# Patient Record
Sex: Female | Born: 1960 | Race: Black or African American | Hispanic: No | Marital: Married | Smoking: Former smoker
Health system: Southern US, Community
[De-identification: ages and names within clinical notes are randomized; demographics above are authoritative.]

## PROBLEM LIST (undated history)

## (undated) DIAGNOSIS — I499 Cardiac arrhythmia, unspecified: Secondary | ICD-10-CM

## (undated) DIAGNOSIS — I10 Essential (primary) hypertension: Secondary | ICD-10-CM

## (undated) DIAGNOSIS — M1990 Unspecified osteoarthritis, unspecified site: Secondary | ICD-10-CM

## (undated) HISTORY — PX: APPENDECTOMY: SHX54

## (undated) HISTORY — PX: LIVER SURGERY: SHX698

## (undated) HISTORY — PX: TUBAL LIGATION: SHX77

## (undated) HISTORY — PX: CHOLECYSTECTOMY: SHX55

---

## 2011-11-11 ENCOUNTER — Emergency Department (INDEPENDENT_AMBULATORY_CARE_PROVIDER_SITE_OTHER): Payer: BC Managed Care – PPO

## 2011-11-11 ENCOUNTER — Encounter: Payer: Self-pay | Admitting: Emergency Medicine

## 2011-11-11 ENCOUNTER — Emergency Department (HOSPITAL_COMMUNITY)
Admission: EM | Admit: 2011-11-11 | Discharge: 2011-11-11 | Disposition: A | Discharge status: Other Acute Inpatient Hospital | Payer: BC Managed Care – PPO | Source: Home / Self Care | Attending: Family Medicine | Admitting: Family Medicine

## 2011-11-11 DIAGNOSIS — IMO0002 Reserved for concepts with insufficient information to code with codable children: Secondary | ICD-10-CM

## 2011-11-11 DIAGNOSIS — M179 Osteoarthritis of knee, unspecified: Secondary | ICD-10-CM

## 2011-11-11 DIAGNOSIS — M1710 Unilateral primary osteoarthritis, unspecified knee: Secondary | ICD-10-CM

## 2011-11-11 DIAGNOSIS — R059 Cough, unspecified: Secondary | ICD-10-CM

## 2011-11-11 DIAGNOSIS — R05 Cough: Secondary | ICD-10-CM

## 2011-11-11 HISTORY — DX: Essential (primary) hypertension: I10

## 2011-11-11 MED ORDER — GUAIFENESIN-CODEINE 100-10 MG/5ML PO SYRP: 5.0000 mL | ORAL_SOLUTION | Freq: Four times a day (QID) | ORAL | Status: AC | PRN

## 2011-11-11 NOTE — ED Provider Notes (Signed)
History     CSN: [id number] Arrival date & time: 11/11/2011  9:28 AM   First MD Initiated Contact with Patient 11/11/11 0913      Chief Complaint  Patient presents with  . URI    (Consider location/radiation/quality/duration/timing/severity/associated sxs/prior treatment) HPI Comments: Destiny Alexander presents for evaluation of cough, congestion, and chronic RIGHT knee pain. She reports that she has to climb several flights of stairs. She has not had any x-rays done and denies any injury.  Patient is a 50 y.o. female presenting with URI and knee pain. The history is provided by the patient.  URI The primary symptoms include cough and arthralgias. Primary symptoms do not include fever, ear pain, sore throat or wheezing. The current episode started 3 to 5 days ago. This is a new problem.  Symptoms associated with the illness include congestion and rhinorrhea. The illness is not associated with chills or sinus pressure.  Knee Pain This is a chronic problem. The problem occurs constantly. The problem has not changed since onset.The symptoms are aggravated by walking, swallowing and exertion. The symptoms are relieved by nothing. She has tried nothing for the symptoms. The treatment provided no relief.    Past Medical History  Diagnosis Date  . Hypertension   . Diabetes mellitus     Past Surgical History  Procedure Date  . Cholecystectomy   . Appendectomy   . Cesarean section   . Tubal ligation     No family history on file.  History  Substance Use Topics  . Smoking status: Former Games developer  . Smokeless tobacco: Not on file  . Alcohol Use: No    OB History    Grav Para Term Preterm Abortions TAB SAB Ect Mult Living                  Review of Systems  Constitutional: Negative for fever and chills.  HENT: Positive for congestion and rhinorrhea. Negative for ear pain, sore throat and sinus pressure.   Eyes: Negative.   Respiratory: Positive for cough. Negative for wheezing.     Cardiovascular: Negative.   Gastrointestinal: Negative.   Genitourinary: Negative.   Musculoskeletal: Positive for arthralgias and gait problem. Negative for joint swelling.       Chronic RIGHT knee pain  Neurological: Negative.     Allergies  Review of patient's allergies indicates no known allergies.  Home Medications   Current Outpatient Rx  Name Route Sig Dispense Refill  . GLIMEPIRIDE 4 MG PO TABS Oral Take 4 mg by mouth daily before breakfast.      . GUAIFENESIN ER 600 MG PO TB12 Oral Take 1,200 mg by mouth 2 (two) times daily.      Marland Kitchen HYDROCHLOROTHIAZIDE 25 MG PO TABS Oral Take 25 mg by mouth daily.      . NYQUIL PO Oral Take by mouth.      . DAYQUIL PO Oral Take by mouth.        BP 184/103  Pulse 98  Temp(Src) 98.6 F (37 C) (Oral)  Resp 20  SpO2 100%  LMP 11/11/2011  Physical Exam  Nursing note and vitals reviewed. Constitutional: She is oriented to person, place, and time. She appears well-developed and well-nourished.  HENT:  Head: Normocephalic and atraumatic.  Right Ear: External ear normal. Tympanic membrane is retracted.  Left Ear: External ear normal. Tympanic membrane is retracted.  Nose: Nose normal.  Mouth/Throat: Uvula is midline, oropharynx is clear and moist and mucous membranes are normal.  Eyes:  EOM are normal. Pupils are equal, round, and reactive to light.  Neck: Normal range of motion.  Cardiovascular: Normal rate and regular rhythm.   Pulmonary/Chest: Effort normal and breath sounds normal.  Neurological: She is alert and oriented to person, place, and time.  Skin: Skin is warm and dry.    ED Course  Procedures (including critical care time)  Labs Reviewed - No data to display No results found.   No diagnosis found.    MDM  Viral URI; RIGHT knee pain: x-ray: degenerative changes in medial compartment (spoke with radiologist)        Richardo Priest, MD 11/11/11 [phone]

## 2011-11-11 NOTE — ED Notes (Signed)
Multiple complaints.  Cough, chest congestion, feels sob, hot/cold episodes.  Was green/gray..now phlegm is clear per patient.  Knee pain since may 2012.  No injury, frequent step use to her home

## 2012-12-28 ENCOUNTER — Encounter (HOSPITAL_COMMUNITY): Payer: Self-pay | Admitting: Emergency Medicine

## 2012-12-28 ENCOUNTER — Emergency Department (HOSPITAL_COMMUNITY)
Admission: EM | Admit: 2012-12-28 | Discharge: 2012-12-28 | Disposition: A | Discharge status: Home / Self Care | Payer: BC Managed Care – PPO | Source: Home / Self Care

## 2012-12-28 ENCOUNTER — Emergency Department (INDEPENDENT_AMBULATORY_CARE_PROVIDER_SITE_OTHER): Payer: BC Managed Care – PPO

## 2012-12-28 DIAGNOSIS — B349 Viral infection, unspecified: Secondary | ICD-10-CM

## 2012-12-28 DIAGNOSIS — B9789 Other viral agents as the cause of diseases classified elsewhere: Secondary | ICD-10-CM

## 2012-12-28 LAB — POCT RAPID STREP A: Streptococcus, Group A Screen (Direct): NEGATIVE

## 2012-12-28 MED ORDER — OSELTAMIVIR PHOSPHATE 75 MG PO CAPS
75.0000 mg | ORAL_CAPSULE | Freq: Two times a day (BID) | ORAL | Status: DC
Start: 2012-12-28 — End: 2017-01-08

## 2012-12-28 NOTE — ED Provider Notes (Signed)
Medical screening examination/treatment/procedure(s) were performed by resident physician or non-physician practitioner and as supervising physician I was immediately available for consultation/collaboration.   Barkley Bruns MD.    Linna Hoff, MD 12/28/12 [phone]

## 2012-12-28 NOTE — ED Provider Notes (Signed)
History     CSN: [id number]  Arrival date & time 12/28/12  1148   None     Chief Complaint  Patient presents with  . URI     Patient is a 52 y.o. female presenting with flu symptoms. The history is provided by the patient.  Influenza The current episode started 2 days ago. The problem occurs constantly. The problem has been gradually worsening. Associated symptoms include headaches. Pertinent negatives include no shortness of breath. Nothing aggravates the symptoms. Nothing relieves the symptoms.  Pt reports onset of flu-like symptoms late Wednesday. States it started with a cough. She though it was her allergies. Other sx's include low grade fever, body aches, sore throat, mild ear pain and h/a's.   Past Medical History  Diagnosis Date  . Hypertension   . Diabetes mellitus     Past Surgical History  Procedure Date  . Cholecystectomy   . Appendectomy   . Cesarean section   . Tubal ligation     No family history on file.  History  Substance Use Topics  . Smoking status: Former Games developer  . Smokeless tobacco: Not on file  . Alcohol Use: No    OB History    Grav Para Term Preterm Abortions TAB SAB Ect Mult Living                  Review of Systems  Constitutional: Negative.   HENT: Positive for ear pain, congestion and sore throat. Negative for sneezing, neck pain, voice change, sinus pressure and ear discharge.   Eyes: Negative.   Respiratory: Positive for cough. Negative for shortness of breath.   Cardiovascular: Negative.   Gastrointestinal: Negative.   Genitourinary: Negative.   Musculoskeletal: Negative.   Neurological: Positive for headaches.  Hematological: Negative.   Psychiatric/Behavioral: Negative.     Allergies  Amoxicillin  Home Medications   Current Outpatient Rx  Name  Route  Sig  Dispense  Refill  . CETIRIZINE HCL PO   Oral   Take by mouth.         Marland Kitchen GLIMEPIRIDE 4 MG PO TABS   Oral   Take 4 mg by mouth daily before breakfast.          . HYDROCHLOROTHIAZIDE 25 MG PO TABS   Oral   Take 25 mg by mouth daily.           Marland Kitchen SITAGLIPTIN PHOSPHATE 100 MG PO TABS   Oral   Take 100 mg by mouth daily.         . GUAIFENESIN ER 600 MG PO TB12   Oral   Take 1,200 mg by mouth 2 (two) times daily.           . NYQUIL PO   Oral   Take by mouth.           . DAYQUIL PO   Oral   Take by mouth.             BP 129/77  Pulse 107  Temp 100.3 F (37.9 C) (Oral)  Resp 18  SpO2 100%  LMP 12/15/2012  Physical Exam  Constitutional: She is oriented to person, place, and time. She appears well-developed and well-nourished.  HENT:  Head: Normocephalic and atraumatic.  Right Ear: External ear normal.  Left Ear: External ear normal.  Nose: Nose normal.  Mouth/Throat: Oropharynx is clear and moist.  Eyes: Conjunctivae normal are normal.  Neck: Neck supple.  Cardiovascular: Normal rate and regular rhythm.  Pulmonary/Chest: Effort normal. She has decreased breath sounds in the right middle field.       Mild exp wheezes to (R) mid and upper lobe areas but otherwise CTA.  Musculoskeletal: Normal range of motion.  Neurological: She is alert and oriented to person, place, and time.  Skin: Skin is warm and dry.  Psychiatric: She has a normal mood and affect.    ED Course  Procedures    Labs Reviewed  POCT RAPID STREP A ([facility] ONLY)   Dg Chest 2 View  12/28/2012  *RADIOLOGY REPORT*  Clinical Data: Cough for 4 days.   URI.  CHEST - 2 VIEW  Comparison: None.  Findings: Mild cardiomegaly.  Pulmonary vascularity normal.  The lungs are clear.  No airspace disease, effusion, or pneumothorax. No acute osseous abnormality.  Visualized upper abdomen is unremarkable.  IMPRESSION: Mild cardiomegaly.  No acute cardiopulmonary disease.   Original Report Authenticated By: Britta Mccreedy, M.D.      No diagnosis found.    MDM  2 day h/o viral type sx's w/ fever. CXR neg. Will treat with  Tamiflu.        Leanne Chang, NP 12/28/12 [phone]

## 2012-12-28 NOTE — ED Notes (Signed)
Pt c/o cold/flu like sx x4 days.  Sx include: headache, body aches, sore throat, cough w/yellow mucous Denies: fevers, vomiting, nauseas, diarrhea Taking Cetrizine thinking it was her allergies  She is alert w/no signs of acute distress.

## 2014-07-08 ENCOUNTER — Other Ambulatory Visit: Payer: Self-pay

## 2014-07-08 DIAGNOSIS — Z1231 Encounter for screening mammogram for malignant neoplasm of breast: Secondary | ICD-10-CM

## 2014-07-20 ENCOUNTER — Ambulatory Visit
Admission: RE | Admit: 2014-07-20 | Discharge: 2014-07-20 | Disposition: A | Discharge status: Home / Self Care | Payer: BC Managed Care – PPO | Source: Ambulatory Visit

## 2014-07-20 DIAGNOSIS — Z1231 Encounter for screening mammogram for malignant neoplasm of breast: Secondary | ICD-10-CM

## 2015-04-30 ENCOUNTER — Other Ambulatory Visit: Payer: Self-pay | Admitting: Physician Assistant

## 2015-04-30 ENCOUNTER — Other Ambulatory Visit (HOSPITAL_COMMUNITY)
Admission: RE | Admit: 2015-04-30 | Discharge: 2015-04-30 | Disposition: A | Discharge status: Home / Self Care | Payer: BLUE CROSS/BLUE SHIELD | Source: Ambulatory Visit | Attending: Physician Assistant | Admitting: Physician Assistant

## 2015-04-30 DIAGNOSIS — Z1151 Encounter for screening for human papillomavirus (HPV): Secondary | ICD-10-CM | POA: Insufficient documentation

## 2015-04-30 DIAGNOSIS — Z124 Encounter for screening for malignant neoplasm of cervix: Secondary | ICD-10-CM | POA: Insufficient documentation

## 2015-05-03 LAB — CYTOLOGY - PAP

## 2016-03-28 DIAGNOSIS — E1165 Type 2 diabetes mellitus with hyperglycemia: Secondary | ICD-10-CM | POA: Diagnosis not present

## 2016-03-28 DIAGNOSIS — E782 Mixed hyperlipidemia: Secondary | ICD-10-CM | POA: Diagnosis not present

## 2016-03-28 DIAGNOSIS — E559 Vitamin D deficiency, unspecified: Secondary | ICD-10-CM | POA: Diagnosis not present

## 2016-03-29 DIAGNOSIS — I10 Essential (primary) hypertension: Secondary | ICD-10-CM | POA: Diagnosis not present

## 2016-03-29 DIAGNOSIS — E559 Vitamin D deficiency, unspecified: Secondary | ICD-10-CM | POA: Diagnosis not present

## 2016-03-29 DIAGNOSIS — E782 Mixed hyperlipidemia: Secondary | ICD-10-CM | POA: Diagnosis not present

## 2016-03-29 DIAGNOSIS — E1165 Type 2 diabetes mellitus with hyperglycemia: Secondary | ICD-10-CM | POA: Diagnosis not present

## 2016-04-12 DIAGNOSIS — I10 Essential (primary) hypertension: Secondary | ICD-10-CM | POA: Diagnosis not present

## 2016-04-12 DIAGNOSIS — E782 Mixed hyperlipidemia: Secondary | ICD-10-CM | POA: Diagnosis not present

## 2016-04-12 DIAGNOSIS — E1165 Type 2 diabetes mellitus with hyperglycemia: Secondary | ICD-10-CM | POA: Diagnosis not present

## 2016-04-12 DIAGNOSIS — E559 Vitamin D deficiency, unspecified: Secondary | ICD-10-CM | POA: Diagnosis not present

## 2016-06-29 DIAGNOSIS — Z0000 Encounter for general adult medical examination without abnormal findings: Secondary | ICD-10-CM | POA: Diagnosis not present

## 2016-07-10 DIAGNOSIS — E782 Mixed hyperlipidemia: Secondary | ICD-10-CM | POA: Diagnosis not present

## 2016-07-10 DIAGNOSIS — E1165 Type 2 diabetes mellitus with hyperglycemia: Secondary | ICD-10-CM | POA: Diagnosis not present

## 2016-07-10 DIAGNOSIS — E559 Vitamin D deficiency, unspecified: Secondary | ICD-10-CM | POA: Diagnosis not present

## 2016-07-11 DIAGNOSIS — E559 Vitamin D deficiency, unspecified: Secondary | ICD-10-CM | POA: Diagnosis not present

## 2016-07-11 DIAGNOSIS — E782 Mixed hyperlipidemia: Secondary | ICD-10-CM | POA: Diagnosis not present

## 2016-07-11 DIAGNOSIS — E1165 Type 2 diabetes mellitus with hyperglycemia: Secondary | ICD-10-CM | POA: Diagnosis not present

## 2016-07-11 DIAGNOSIS — I10 Essential (primary) hypertension: Secondary | ICD-10-CM | POA: Diagnosis not present

## 2016-08-24 ENCOUNTER — Ambulatory Visit (HOSPITAL_COMMUNITY)
Admission: EM | Admit: 2016-08-24 | Discharge: 2016-08-24 | Disposition: A | Discharge status: Home / Self Care | Payer: BLUE CROSS/BLUE SHIELD | Attending: Internal Medicine | Admitting: Internal Medicine

## 2016-08-24 ENCOUNTER — Encounter (HOSPITAL_COMMUNITY): Payer: Self-pay | Admitting: Emergency Medicine

## 2016-08-24 DIAGNOSIS — R0789 Other chest pain: Secondary | ICD-10-CM

## 2016-08-24 MED ORDER — IBUPROFEN 800 MG PO TABS
ORAL_TABLET | ORAL | Status: AC
  Filled 2016-08-24: qty 1

## 2016-08-24 MED ORDER — IBUPROFEN 800 MG PO TABS
800.0000 mg | ORAL_TABLET | Freq: Once | ORAL | Status: AC
  Administered 2016-08-24: 800 mg via ORAL

## 2016-08-24 MED ORDER — CYCLOBENZAPRINE HCL 10 MG PO TABS: 10.0000 mg | ORAL_TABLET | Freq: Every evening | ORAL | 1 refills | Status: AC | PRN

## 2016-08-24 NOTE — ED Triage Notes (Addendum)
Pt c/o constant left sided CP onset last night  Denies SOB/dyspnea, n/v, weakness, blurred vision, HA Denies inj/trauma  Reports pain increases w/activity .... Hx of HTN and DM  Steady gait.... A&O x4... NAD

## 2016-08-24 NOTE — ED Provider Notes (Signed)
[facility]    CSN: [id number] Arrival date & time: 08/24/16  1830  First Provider Contact:  None       History   Chief Complaint Chief Complaint  Patient presents with  . Chest Pain    HPI Destiny Alexander is a 55 y.o. female.   Patient with PMH of obesity and DM2 presents to urgent care c/o chest pain that began last night at rest and has persisted until now without relief. She has used naproxen without significant relief.  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  Denies SOB, nausea, vomiting or diaphoresis.  Also has had cough for which she has been seen recently.  Cough exacerbates pain.        Past Medical History:  Diagnosis Date  . Diabetes mellitus   . Hypertension     There are no active problems to display for this patient.   Past Surgical History:  Procedure Laterality Date  . APPENDECTOMY    . CESAREAN SECTION    . CHOLECYSTECTOMY    . TUBAL LIGATION      OB History    No data available       Home Medications    Prior to Admission medications   Medication Sig Start Date End Date Taking? Authorizing Provider  glimepiride (AMARYL) 4 MG tablet Take 4 mg by mouth daily before breakfast.     Yes Historical Provider, MD  Insulin Glargine (TOUJEO SOLOSTAR [place]) Inject into the skin.   Yes Historical Provider, MD  levocetirizine (XYZAL) 5 MG tablet Take 5 mg by mouth every evening.   Yes Historical Provider, MD  Liraglutide (VICTOZA ) Inject into the skin.   Yes Historical Provider, MD  naproxen (NAPROSYN) 500 MG tablet Take 500 mg by mouth 2 (two) times daily with a meal.   Yes Historical Provider, MD  pravastatin (PRAVACHOL) 10 MG tablet Take 10 mg by mouth daily.   Yes Historical Provider, MD  CETIRIZINE HCL PO Take by mouth.    Historical Provider, MD  cyclobenzaprine (FLEXERIL) 10 MG tablet Take 1 tablet (10 mg total) by mouth at bedtime as needed for muscle spasms. 08/24/16 09/03/16  Arnaldo NatalMichael S Taye Cato, MD  guaiFENesin  (MUCINEX) 600 MG 12 hr tablet Take 1,200 mg by mouth 2 (two) times daily.      Historical Provider, MD  hydrochlorothiazide (HYDRODIURIL) 25 MG tablet Take 25 mg by mouth daily.      Historical Provider, MD  oseltamivir (TAMIFLU) 75 MG capsule Take 1 capsule (75 mg total) by mouth every 12 (twelve) hours. 12/28/12   Roma KayserKatherine P Schorr, NP  Pseudoeph-Doxylamine-DM-APAP (NYQUIL PO) Take by mouth.      Historical Provider, MD  Pseudoephedrine-APAP-DM (DAYQUIL PO) Take by mouth.      Historical Provider, MD  sitaGLIPtin (JANUVIA) 100 MG tablet Take 100 mg by mouth daily.    Historical Provider, MD    Family History History reviewed. No pertinent family history.  Social History Social History  Substance Use Topics  . Smoking status: Former Games developermoker  . Smokeless tobacco: Never Used  . Alcohol use No     Allergies   Amoxicillin   Review of Systems Review of Systems  Constitutional: Negative for chills and fever.  HENT: Negative for ear pain and sore throat.   Eyes: Negative for pain and visual disturbance.  Respiratory: Negative for cough and shortness of breath.   Cardiovascular: Positive for chest pain. Negative for palpitations.  Gastrointestinal: Negative for abdominal pain  and vomiting.  Genitourinary: Negative for dysuria and hematuria.  Musculoskeletal: Negative for arthralgias and back pain.  Skin: Negative for color change and rash.  Neurological: Negative for seizures and syncope.  All other systems reviewed and are negative.    Physical Exam Triage Vital Signs ED Triage Vitals  Enc Vitals Group     BP 08/24/16 1931 130/57     Pulse Rate 08/24/16 1931 85     Resp 08/24/16 1931 16     Temp 08/24/16 1931 98.2 F (36.8 C)     Temp Source 08/24/16 1931 Oral     SpO2 08/24/16 1931 100 %     Weight --      Height --      Head Circumference --      Peak Flow --      Pain Score 08/24/16 1937 6     Pain Loc --      Pain Edu? --      Excl. in GC? --    No data  found.   Updated Vital Signs BP 130/57 (BP Location: Left Arm)   Pulse 85   Temp 98.2 F (36.8 C) (Oral)   Resp 16   SpO2 100%   Visual Acuity Right Eye Distance:   Left Eye Distance:   Bilateral Distance:    Right Eye Near:   Left Eye Near:    Bilateral Near:     Physical Exam  Constitutional: She is oriented to person, place, and time. She appears well-developed and well-nourished. No distress.  HENT:  Head: Normocephalic and atraumatic.  Mouth/Throat: Oropharynx is clear and moist.  Eyes: Conjunctivae and EOM are normal. Pupils are equal, round, and reactive to light. No scleral icterus.  Neck: Normal range of motion. Neck supple. No JVD present. No tracheal deviation present. No thyromegaly present.  Cardiovascular: Normal rate, regular rhythm and normal heart sounds.  Exam reveals no gallop and no friction rub.   No murmur heard. Pulmonary/Chest: Effort normal and breath sounds normal.  Abdominal: Soft. Bowel sounds are normal. She exhibits no distension. There is no tenderness.  Musculoskeletal: Normal range of motion. She exhibits no edema.  Lymphadenopathy:    She has no cervical adenopathy.  Neurological: She is alert and oriented to person, place, and time. No cranial nerve deficit.  Skin: Skin is warm and dry.  Psychiatric: She has a normal mood and affect. Her behavior is normal. Judgment and thought content normal.  Nursing note and vitals reviewed.    UC Treatments / Results  Labs (all labs ordered are listed, but only abnormal results are displayed) Labs Reviewed - No data to display  EKG  EKG Interpretation None       Radiology No results found.  Procedures Procedures (including critical care time)  Medications Ordered in UC Medications  ibuprofen (ADVIL,MOTRIN) tablet 800 mg (not administered)     Initial Impression / Assessment and Plan / UC Course  I have reviewed the triage vital signs and the nursing notes.  Pertinent labs &  imaging results that were available during my care of the patient were reviewed by me and considered in my medical decision making (see chart for details).  Clinical Course    Musculoskeletal chest pain.  Non cardiac.  Rx NSAID and muscle relaxer.  Final Clinical Impressions(s) / UC Diagnoses   Final diagnoses:  Chest wall pain    New Prescriptions New Prescriptions   CYCLOBENZAPRINE (FLEXERIL) 10 MG TABLET    Take 1  tablet (10 mg total) by mouth at bedtime as needed for muscle spasms.     Arnaldo Natal, MD 08/24/16 2033

## 2016-09-08 DIAGNOSIS — H5213 Myopia, bilateral: Secondary | ICD-10-CM | POA: Diagnosis not present

## 2016-10-16 DIAGNOSIS — E559 Vitamin D deficiency, unspecified: Secondary | ICD-10-CM | POA: Diagnosis not present

## 2016-10-16 DIAGNOSIS — E782 Mixed hyperlipidemia: Secondary | ICD-10-CM | POA: Diagnosis not present

## 2016-10-16 DIAGNOSIS — E1165 Type 2 diabetes mellitus with hyperglycemia: Secondary | ICD-10-CM | POA: Diagnosis not present

## 2016-10-24 DIAGNOSIS — E782 Mixed hyperlipidemia: Secondary | ICD-10-CM | POA: Diagnosis not present

## 2016-10-24 DIAGNOSIS — I10 Essential (primary) hypertension: Secondary | ICD-10-CM | POA: Diagnosis not present

## 2016-10-24 DIAGNOSIS — E1165 Type 2 diabetes mellitus with hyperglycemia: Secondary | ICD-10-CM | POA: Diagnosis not present

## 2016-10-24 DIAGNOSIS — Z23 Encounter for immunization: Secondary | ICD-10-CM | POA: Diagnosis not present

## 2016-10-24 DIAGNOSIS — E559 Vitamin D deficiency, unspecified: Secondary | ICD-10-CM | POA: Diagnosis not present

## 2016-10-31 ENCOUNTER — Encounter (HOSPITAL_COMMUNITY): Payer: Self-pay | Admitting: *Deleted

## 2016-10-31 ENCOUNTER — Emergency Department (HOSPITAL_COMMUNITY): Payer: BLUE CROSS/BLUE SHIELD

## 2016-10-31 ENCOUNTER — Emergency Department (HOSPITAL_COMMUNITY)
Admission: EM | Admit: 2016-10-31 | Discharge: 2016-10-31 | Disposition: A | Discharge status: Home / Self Care | Payer: BLUE CROSS/BLUE SHIELD | Attending: Emergency Medicine | Admitting: Emergency Medicine

## 2016-10-31 DIAGNOSIS — I10 Essential (primary) hypertension: Secondary | ICD-10-CM | POA: Insufficient documentation

## 2016-10-31 DIAGNOSIS — Z794 Long term (current) use of insulin: Secondary | ICD-10-CM | POA: Diagnosis not present

## 2016-10-31 DIAGNOSIS — E119 Type 2 diabetes mellitus without complications: Secondary | ICD-10-CM | POA: Diagnosis not present

## 2016-10-31 DIAGNOSIS — Z87891 Personal history of nicotine dependence: Secondary | ICD-10-CM | POA: Diagnosis not present

## 2016-10-31 DIAGNOSIS — R0789 Other chest pain: Secondary | ICD-10-CM | POA: Diagnosis not present

## 2016-10-31 DIAGNOSIS — R0781 Pleurodynia: Secondary | ICD-10-CM | POA: Diagnosis not present

## 2016-10-31 DIAGNOSIS — Y999 Unspecified external cause status: Secondary | ICD-10-CM | POA: Insufficient documentation

## 2016-10-31 DIAGNOSIS — Y939 Activity, unspecified: Secondary | ICD-10-CM | POA: Insufficient documentation

## 2016-10-31 DIAGNOSIS — S43004A Unspecified dislocation of right shoulder joint, initial encounter: Secondary | ICD-10-CM | POA: Diagnosis not present

## 2016-10-31 DIAGNOSIS — M25511 Pain in right shoulder: Secondary | ICD-10-CM | POA: Insufficient documentation

## 2016-10-31 DIAGNOSIS — S31609A Unspecified open wound of abdominal wall, unspecified quadrant with penetration into peritoneal cavity, initial encounter: Secondary | ICD-10-CM | POA: Diagnosis not present

## 2016-10-31 DIAGNOSIS — S299XXA Unspecified injury of thorax, initial encounter: Secondary | ICD-10-CM | POA: Diagnosis not present

## 2016-10-31 DIAGNOSIS — Y92410 Unspecified street and highway as the place of occurrence of the external cause: Secondary | ICD-10-CM | POA: Diagnosis not present

## 2016-10-31 DIAGNOSIS — R100 Acute abdomen: Secondary | ICD-10-CM | POA: Diagnosis not present

## 2016-10-31 MED ORDER — IBUPROFEN 400 MG PO TABS
600.0000 mg | ORAL_TABLET | Freq: Once | ORAL | Status: AC
  Administered 2016-10-31: 600 mg via ORAL
  Filled 2016-10-31: qty 1

## 2016-10-31 MED ORDER — METHOCARBAMOL 500 MG PO TABS: 500.0000 mg | ORAL_TABLET | Freq: Every evening | ORAL | 0 refills | Status: DC | PRN

## 2016-10-31 MED ORDER — METHOCARBAMOL 500 MG PO TABS
1000.0000 mg | ORAL_TABLET | Freq: Once | ORAL | Status: AC
  Administered 2016-10-31: 1000 mg via ORAL
  Filled 2016-10-31: qty 2

## 2016-10-31 MED ORDER — IBUPROFEN 600 MG PO TABS: 600.0000 mg | ORAL_TABLET | Freq: Four times a day (QID) | ORAL | 0 refills | Status: DC | PRN
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
## 2016-11-09 DIAGNOSIS — E782 Mixed hyperlipidemia: Secondary | ICD-10-CM | POA: Diagnosis not present

## 2016-11-09 DIAGNOSIS — E559 Vitamin D deficiency, unspecified: Secondary | ICD-10-CM | POA: Diagnosis not present

## 2016-11-09 DIAGNOSIS — E1165 Type 2 diabetes mellitus with hyperglycemia: Secondary | ICD-10-CM | POA: Diagnosis not present

## 2016-11-09 DIAGNOSIS — I10 Essential (primary) hypertension: Secondary | ICD-10-CM | POA: Diagnosis not present

## 2016-11-10 DIAGNOSIS — S43001D Unspecified subluxation of right shoulder joint, subsequent encounter: Secondary | ICD-10-CM | POA: Diagnosis not present

## 2016-11-24 DIAGNOSIS — M25511 Pain in right shoulder: Secondary | ICD-10-CM | POA: Diagnosis not present

## 2016-11-24 DIAGNOSIS — M542 Cervicalgia: Secondary | ICD-10-CM | POA: Diagnosis not present

## 2016-11-28 DIAGNOSIS — M542 Cervicalgia: Secondary | ICD-10-CM | POA: Diagnosis not present

## 2016-11-29 DIAGNOSIS — M7541 Impingement syndrome of right shoulder: Secondary | ICD-10-CM | POA: Diagnosis not present

## 2016-11-29 DIAGNOSIS — M542 Cervicalgia: Secondary | ICD-10-CM | POA: Diagnosis not present

## 2016-11-29 DIAGNOSIS — M25511 Pain in right shoulder: Secondary | ICD-10-CM | POA: Diagnosis not present

## 2016-11-29 DIAGNOSIS — S46011A Strain of muscle(s) and tendon(s) of the rotator cuff of right shoulder, initial encounter: Secondary | ICD-10-CM | POA: Diagnosis not present

## 2016-12-07 DIAGNOSIS — M5416 Radiculopathy, lumbar region: Secondary | ICD-10-CM | POA: Diagnosis not present

## 2016-12-07 DIAGNOSIS — M542 Cervicalgia: Secondary | ICD-10-CM | POA: Diagnosis not present

## 2016-12-07 DIAGNOSIS — M7541 Impingement syndrome of right shoulder: Secondary | ICD-10-CM | POA: Diagnosis not present

## 2016-12-12 DIAGNOSIS — F0781 Postconcussional syndrome: Secondary | ICD-10-CM | POA: Diagnosis not present

## 2016-12-12 DIAGNOSIS — S060X1A Concussion with loss of consciousness of 30 minutes or less, initial encounter: Secondary | ICD-10-CM | POA: Diagnosis not present

## 2016-12-15 DIAGNOSIS — M5416 Radiculopathy, lumbar region: Secondary | ICD-10-CM | POA: Diagnosis not present

## 2016-12-25 DIAGNOSIS — M542 Cervicalgia: Secondary | ICD-10-CM | POA: Diagnosis not present

## 2016-12-25 DIAGNOSIS — S060X1D Concussion with loss of consciousness of 30 minutes or less, subsequent encounter: Secondary | ICD-10-CM | POA: Diagnosis not present

## 2016-12-25 DIAGNOSIS — M7541 Impingement syndrome of right shoulder: Secondary | ICD-10-CM | POA: Diagnosis not present

## 2016-12-25 DIAGNOSIS — M5416 Radiculopathy, lumbar region: Secondary | ICD-10-CM | POA: Diagnosis not present

## 2016-12-29 ENCOUNTER — Ambulatory Visit: Payer: BLUE CROSS/BLUE SHIELD | Admitting: Neurology

## 2016-12-29 ENCOUNTER — Telehealth: Payer: Self-pay | Admitting: *Deleted

## 2016-12-29 NOTE — Telephone Encounter (Signed)
Called pt. R/s appt from 12/28/16 since office closed. [facility]K 12/29/16. Patient requested appt before 01/15/17. Dr Lucia GaskinsAhern did not have any openings. Pt ok to see different provider. Scheduled with Dr Marjory LiesPenumalli. She is a new patient.

## 2017-01-04 ENCOUNTER — Encounter: Payer: Self-pay | Admitting: *Deleted

## 2017-01-08 ENCOUNTER — Encounter: Payer: Self-pay | Admitting: Diagnostic Neuroimaging

## 2017-01-08 ENCOUNTER — Ambulatory Visit (INDEPENDENT_AMBULATORY_CARE_PROVIDER_SITE_OTHER): Payer: BLUE CROSS/BLUE SHIELD | Admitting: Diagnostic Neuroimaging

## 2017-01-08 VITALS — BP 148/84 | HR 88 | Ht 65.0 in | Wt 309.5 lb

## 2017-01-08 DIAGNOSIS — F0781 Postconcussional syndrome: Secondary | ICD-10-CM

## 2017-01-08 DIAGNOSIS — R0683 Snoring: Secondary | ICD-10-CM | POA: Diagnosis not present

## 2017-01-08 DIAGNOSIS — Z794 Long term (current) use of insulin: Secondary | ICD-10-CM | POA: Diagnosis not present

## 2017-01-08 DIAGNOSIS — R5383 Other fatigue: Secondary | ICD-10-CM | POA: Diagnosis not present

## 2017-01-08 DIAGNOSIS — I10 Essential (primary) hypertension: Secondary | ICD-10-CM | POA: Diagnosis not present

## 2017-01-08 DIAGNOSIS — Z6841 Body Mass Index (BMI) 40.0 and over, adult: Secondary | ICD-10-CM | POA: Diagnosis not present

## 2017-01-08 DIAGNOSIS — S140XXA Concussion and edema of cervical spinal cord, initial encounter: Secondary | ICD-10-CM

## 2017-01-08 DIAGNOSIS — E119 Type 2 diabetes mellitus without complications: Secondary | ICD-10-CM

## 2017-01-08 DIAGNOSIS — G44329 Chronic post-traumatic headache, not intractable: Secondary | ICD-10-CM
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
## 2017-01-08 NOTE — Patient Instructions (Signed)

## 2017-01-15 DIAGNOSIS — S060X1D Concussion with loss of consciousness of 30 minutes or less, subsequent encounter: Secondary | ICD-10-CM | POA: Diagnosis not present

## 2017-01-15 DIAGNOSIS — S46011D Strain of muscle(s) and tendon(s) of the rotator cuff of right shoulder, subsequent encounter: Secondary | ICD-10-CM | POA: Diagnosis not present

## 2017-01-15 DIAGNOSIS — M25562 Pain in left knee: Secondary | ICD-10-CM | POA: Diagnosis not present

## 2017-01-15 DIAGNOSIS — M542 Cervicalgia: Secondary | ICD-10-CM | POA: Diagnosis not present

## 2017-01-16 ENCOUNTER — Other Ambulatory Visit: Payer: Self-pay | Admitting: Specialist

## 2017-01-16 DIAGNOSIS — M542 Cervicalgia: Secondary | ICD-10-CM

## 2017-01-17 ENCOUNTER — Ambulatory Visit
Admission: RE | Admit: 2017-01-17 | Discharge: 2017-01-17 | Disposition: A | Discharge status: Home / Self Care | Payer: BLUE CROSS/BLUE SHIELD | Source: Ambulatory Visit | Attending: Specialist | Admitting: Specialist

## 2017-01-17 DIAGNOSIS — M4802 Spinal stenosis, cervical region: Secondary | ICD-10-CM | POA: Diagnosis not present

## 2017-01-17 DIAGNOSIS — M542 Cervicalgia: Secondary | ICD-10-CM

## 2017-01-23 DIAGNOSIS — M5416 Radiculopathy, lumbar region: Secondary | ICD-10-CM | POA: Diagnosis not present

## 2017-01-23 DIAGNOSIS — M542 Cervicalgia: Secondary | ICD-10-CM | POA: Diagnosis not present

## 2017-01-31 DIAGNOSIS — M542 Cervicalgia: Secondary | ICD-10-CM | POA: Diagnosis not present

## 2017-01-31 DIAGNOSIS — M5416 Radiculopathy, lumbar region: Secondary | ICD-10-CM | POA: Diagnosis not present

## 2017-02-05 DIAGNOSIS — S335XXA Sprain of ligaments of lumbar spine, initial encounter: Secondary | ICD-10-CM | POA: Diagnosis not present

## 2017-02-05 DIAGNOSIS — M542 Cervicalgia: Secondary | ICD-10-CM | POA: Diagnosis not present

## 2017-02-05 DIAGNOSIS — M48061 Spinal stenosis, lumbar region without neurogenic claudication: Secondary | ICD-10-CM | POA: Diagnosis not present

## 2017-02-07 DIAGNOSIS — M542 Cervicalgia: Secondary | ICD-10-CM | POA: Diagnosis not present

## 2017-02-07 DIAGNOSIS — M5416 Radiculopathy, lumbar region: Secondary | ICD-10-CM | POA: Diagnosis not present

## 2017-02-14 DIAGNOSIS — M542 Cervicalgia: Secondary | ICD-10-CM | POA: Diagnosis not present

## 2017-02-14 DIAGNOSIS — M5416 Radiculopathy, lumbar region: Secondary | ICD-10-CM | POA: Diagnosis not present
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
## 2017-02-21 DIAGNOSIS — M5416 Radiculopathy, lumbar region: Secondary | ICD-10-CM | POA: Diagnosis not present

## 2017-02-21 DIAGNOSIS — M542 Cervicalgia: Secondary | ICD-10-CM | POA: Diagnosis not present

## 2017-02-28 DIAGNOSIS — M542 Cervicalgia: Secondary | ICD-10-CM | POA: Diagnosis not present

## 2017-02-28 DIAGNOSIS — M5416 Radiculopathy, lumbar region: Secondary | ICD-10-CM | POA: Diagnosis not present

## 2017-03-05 DIAGNOSIS — E119 Type 2 diabetes mellitus without complications: Secondary | ICD-10-CM | POA: Diagnosis not present

## 2017-03-07 DIAGNOSIS — M542 Cervicalgia: Secondary | ICD-10-CM | POA: Diagnosis not present

## 2017-03-07 DIAGNOSIS — M5416 Radiculopathy, lumbar region: Secondary | ICD-10-CM | POA: Diagnosis not present

## 2017-03-09 DIAGNOSIS — E1165 Type 2 diabetes mellitus with hyperglycemia: Secondary | ICD-10-CM | POA: Diagnosis not present

## 2017-03-09 DIAGNOSIS — I10 Essential (primary) hypertension: Secondary | ICD-10-CM | POA: Diagnosis not present

## 2017-03-09 DIAGNOSIS — E559 Vitamin D deficiency, unspecified: Secondary | ICD-10-CM | POA: Diagnosis not present

## 2017-03-09 DIAGNOSIS — E782 Mixed hyperlipidemia: Secondary | ICD-10-CM | POA: Diagnosis not present

## 2017-03-20 DIAGNOSIS — M25562 Pain in left knee: Secondary | ICD-10-CM | POA: Diagnosis not present

## 2017-03-20 DIAGNOSIS — M7541 Impingement syndrome of right shoulder: Secondary | ICD-10-CM | POA: Diagnosis not present

## 2017-03-20 DIAGNOSIS — M48061 Spinal stenosis, lumbar region without neurogenic claudication: Secondary | ICD-10-CM | POA: Diagnosis not present

## 2017-03-20 DIAGNOSIS — M5416 Radiculopathy, lumbar region: Secondary | ICD-10-CM | POA: Diagnosis not present

## 2017-04-02 DIAGNOSIS — M25562 Pain in left knee: Secondary | ICD-10-CM | POA: Diagnosis not present

## 2017-04-06 DIAGNOSIS — M7541 Impingement syndrome of right shoulder: Secondary | ICD-10-CM | POA: Diagnosis not present

## 2017-04-06 DIAGNOSIS — M48061 Spinal stenosis, lumbar region without neurogenic claudication: Secondary | ICD-10-CM | POA: Diagnosis not present

## 2017-04-06 DIAGNOSIS — M542 Cervicalgia: Secondary | ICD-10-CM | POA: Diagnosis not present

## 2017-04-06 DIAGNOSIS — M25562 Pain in left knee: Secondary | ICD-10-CM | POA: Diagnosis not present

## 2017-04-09 DIAGNOSIS — E119 Type 2 diabetes mellitus without complications: Secondary | ICD-10-CM | POA: Diagnosis not present

## 2017-04-09 DIAGNOSIS — Z01818 Encounter for other preprocedural examination: Secondary | ICD-10-CM | POA: Diagnosis not present

## 2017-04-09 DIAGNOSIS — I10 Essential (primary) hypertension: Secondary | ICD-10-CM | POA: Diagnosis not present

## 2017-04-09 DIAGNOSIS — M1711 Unilateral primary osteoarthritis, right knee: Secondary | ICD-10-CM | POA: Diagnosis not present

## 2017-04-16 DIAGNOSIS — D649 Anemia, unspecified: Secondary | ICD-10-CM | POA: Diagnosis not present

## 2017-07-02 DIAGNOSIS — J309 Allergic rhinitis, unspecified: Secondary | ICD-10-CM | POA: Diagnosis not present

## 2017-07-02 DIAGNOSIS — E119 Type 2 diabetes mellitus without complications: Secondary | ICD-10-CM | POA: Diagnosis not present

## 2017-07-02 DIAGNOSIS — R350 Frequency of micturition: Secondary | ICD-10-CM | POA: Diagnosis not present

## 2017-07-02 DIAGNOSIS — E785 Hyperlipidemia, unspecified: Secondary | ICD-10-CM | POA: Diagnosis not present

## 2017-07-02 DIAGNOSIS — D509 Iron deficiency anemia, unspecified: Secondary | ICD-10-CM | POA: Diagnosis not present

## 2017-07-02 DIAGNOSIS — Z0000 Encounter for general adult medical examination without abnormal findings: Secondary | ICD-10-CM | POA: Diagnosis not present

## 2017-07-02 DIAGNOSIS — E559 Vitamin D deficiency, unspecified: Secondary | ICD-10-CM | POA: Diagnosis not present

## 2017-07-02 DIAGNOSIS — I10 Essential (primary) hypertension: Secondary | ICD-10-CM | POA: Diagnosis not present

## 2017-07-05 DIAGNOSIS — M542 Cervicalgia: Secondary | ICD-10-CM | POA: Diagnosis not present

## 2017-10-02 DIAGNOSIS — M5416 Radiculopathy, lumbar region: Secondary | ICD-10-CM | POA: Diagnosis not present

## 2017-10-02 DIAGNOSIS — Z01812 Encounter for preprocedural laboratory examination: Secondary | ICD-10-CM | POA: Diagnosis not present

## 2017-10-02 DIAGNOSIS — M542 Cervicalgia: Secondary | ICD-10-CM | POA: Diagnosis not present

## 2017-10-08 DIAGNOSIS — Z01812 Encounter for preprocedural laboratory examination: Secondary | ICD-10-CM | POA: Diagnosis not present

## 2017-10-09 DIAGNOSIS — M542 Cervicalgia: Secondary | ICD-10-CM | POA: Diagnosis not present

## 2017-10-15 DIAGNOSIS — M542 Cervicalgia: Secondary | ICD-10-CM | POA: Diagnosis not present

## 2017-10-15 DIAGNOSIS — M5416 Radiculopathy, lumbar region: Secondary | ICD-10-CM | POA: Diagnosis not present

## 2017-10-26 DIAGNOSIS — E1165 Type 2 diabetes mellitus with hyperglycemia: Secondary | ICD-10-CM | POA: Diagnosis not present

## 2017-10-26 DIAGNOSIS — Z6841 Body Mass Index (BMI) 40.0 and over, adult: Secondary | ICD-10-CM | POA: Diagnosis not present

## 2017-10-26 DIAGNOSIS — I10 Essential (primary) hypertension: Secondary | ICD-10-CM | POA: Diagnosis not present

## 2017-11-08 DIAGNOSIS — E1165 Type 2 diabetes mellitus with hyperglycemia: Secondary | ICD-10-CM | POA: Diagnosis not present

## 2017-11-16 DIAGNOSIS — M5137 Other intervertebral disc degeneration, lumbosacral region: Secondary | ICD-10-CM | POA: Diagnosis not present

## 2017-11-16 DIAGNOSIS — M545 Low back pain: Secondary | ICD-10-CM | POA: Diagnosis not present

## 2017-12-19 DIAGNOSIS — I10 Essential (primary) hypertension: Secondary | ICD-10-CM | POA: Diagnosis not present

## 2017-12-19 DIAGNOSIS — E1165 Type 2 diabetes mellitus with hyperglycemia: Secondary | ICD-10-CM | POA: Diagnosis not present

## 2017-12-19 DIAGNOSIS — Z6841 Body Mass Index (BMI) 40.0 and over, adult: Secondary | ICD-10-CM | POA: Diagnosis not present

## 2018-01-07 DIAGNOSIS — D509 Iron deficiency anemia, unspecified: Secondary | ICD-10-CM | POA: Diagnosis not present

## 2018-01-07 DIAGNOSIS — E119 Type 2 diabetes mellitus without complications: Secondary | ICD-10-CM | POA: Diagnosis not present

## 2018-01-07 DIAGNOSIS — I10 Essential (primary) hypertension: Secondary | ICD-10-CM | POA: Diagnosis not present

## 2018-01-18 DIAGNOSIS — M1712 Unilateral primary osteoarthritis, left knee: Secondary | ICD-10-CM | POA: Diagnosis not present

## 2018-01-18 DIAGNOSIS — S83242D Other tear of medial meniscus, current injury, left knee, subsequent encounter: Secondary | ICD-10-CM | POA: Diagnosis not present

## 2018-01-18 DIAGNOSIS — M25562 Pain in left knee: Secondary | ICD-10-CM | POA: Diagnosis not present

## 2018-01-22 DIAGNOSIS — I10 Essential (primary) hypertension: Secondary | ICD-10-CM | POA: Diagnosis not present

## 2018-01-22 DIAGNOSIS — Z6841 Body Mass Index (BMI) 40.0 and over, adult: Secondary | ICD-10-CM | POA: Diagnosis not present

## 2018-01-29 DIAGNOSIS — J988 Other specified respiratory disorders: Secondary | ICD-10-CM | POA: Diagnosis not present

## 2018-02-06 DIAGNOSIS — E119 Type 2 diabetes mellitus without complications: Secondary | ICD-10-CM | POA: Diagnosis not present

## 2018-02-06 DIAGNOSIS — Z713 Dietary counseling and surveillance: Secondary | ICD-10-CM | POA: Diagnosis not present

## 2018-02-09 DIAGNOSIS — E1165 Type 2 diabetes mellitus with hyperglycemia: Secondary | ICD-10-CM | POA: Diagnosis not present

## 2018-02-16 DIAGNOSIS — M79604 Pain in right leg: Secondary | ICD-10-CM | POA: Diagnosis not present

## 2018-03-05 DIAGNOSIS — I10 Essential (primary) hypertension: Secondary | ICD-10-CM | POA: Diagnosis not present

## 2018-03-05 DIAGNOSIS — Z6841 Body Mass Index (BMI) 40.0 and over, adult: Secondary | ICD-10-CM | POA: Diagnosis not present

## 2018-03-14 DIAGNOSIS — E1165 Type 2 diabetes mellitus with hyperglycemia: Secondary | ICD-10-CM | POA: Diagnosis not present

## 2018-03-14 DIAGNOSIS — E559 Vitamin D deficiency, unspecified: Secondary | ICD-10-CM | POA: Diagnosis not present

## 2018-03-14 DIAGNOSIS — I10 Essential (primary) hypertension: Secondary | ICD-10-CM | POA: Diagnosis not present

## 2018-03-14 DIAGNOSIS — E782 Mixed hyperlipidemia: Secondary | ICD-10-CM | POA: Diagnosis not present

## 2018-03-19 ENCOUNTER — Other Ambulatory Visit: Payer: Self-pay | Admitting: Orthopedic Surgery

## 2018-03-21 DIAGNOSIS — Z713 Dietary counseling and surveillance: Secondary | ICD-10-CM | POA: Diagnosis not present

## 2018-03-21 DIAGNOSIS — Z6841 Body Mass Index (BMI) 40.0 and over, adult: Secondary | ICD-10-CM | POA: Diagnosis not present

## 2018-03-25 ENCOUNTER — Other Ambulatory Visit: Payer: Self-pay | Admitting: Orthopedic Surgery

## 2018-03-25 DIAGNOSIS — M542 Cervicalgia: Secondary | ICD-10-CM

## 2018-04-09 ENCOUNTER — Ambulatory Visit
Admission: RE | Admit: 2018-04-09 | Discharge: 2018-04-09 | Disposition: A | Discharge status: Home / Self Care | Payer: BLUE CROSS/BLUE SHIELD | Source: Ambulatory Visit | Attending: Orthopedic Surgery | Admitting: Orthopedic Surgery

## 2018-04-09 DIAGNOSIS — M4802 Spinal stenosis, cervical region: Secondary | ICD-10-CM | POA: Diagnosis not present

## 2018-04-09 DIAGNOSIS — M542 Cervicalgia: Secondary | ICD-10-CM

## 2018-04-18 DIAGNOSIS — E1165 Type 2 diabetes mellitus with hyperglycemia: Secondary | ICD-10-CM | POA: Diagnosis not present

## 2018-04-18 DIAGNOSIS — Z6841 Body Mass Index (BMI) 40.0 and over, adult: Secondary | ICD-10-CM | POA: Diagnosis not present

## 2018-04-18 DIAGNOSIS — I10 Essential (primary) hypertension: Secondary | ICD-10-CM | POA: Diagnosis not present

## 2018-05-11 DIAGNOSIS — E1165 Type 2 diabetes mellitus with hyperglycemia: Secondary | ICD-10-CM | POA: Diagnosis not present

## 2018-05-30 DIAGNOSIS — Z713 Dietary counseling and surveillance: Secondary | ICD-10-CM | POA: Diagnosis not present

## 2018-05-30 DIAGNOSIS — Z6841 Body Mass Index (BMI) 40.0 and over, adult: Secondary | ICD-10-CM | POA: Diagnosis not present

## 2018-07-22 DIAGNOSIS — E119 Type 2 diabetes mellitus without complications: Secondary | ICD-10-CM | POA: Diagnosis not present

## 2018-07-22 DIAGNOSIS — I10 Essential (primary) hypertension: Secondary | ICD-10-CM | POA: Diagnosis not present

## 2018-07-22 DIAGNOSIS — N920 Excessive and frequent menstruation with regular cycle: Secondary | ICD-10-CM | POA: Diagnosis not present

## 2018-07-22 DIAGNOSIS — Z0000 Encounter for general adult medical examination without abnormal findings: Secondary | ICD-10-CM | POA: Diagnosis not present

## 2018-07-22 DIAGNOSIS — Z1159 Encounter for screening for other viral diseases: Secondary | ICD-10-CM | POA: Diagnosis not present

## 2018-07-22 DIAGNOSIS — E785 Hyperlipidemia, unspecified: Secondary | ICD-10-CM | POA: Diagnosis not present

## 2018-08-10 DIAGNOSIS — E1165 Type 2 diabetes mellitus with hyperglycemia: Secondary | ICD-10-CM | POA: Diagnosis not present

## 2018-09-09 DIAGNOSIS — E1165 Type 2 diabetes mellitus with hyperglycemia: Secondary | ICD-10-CM | POA: Diagnosis not present

## 2018-09-09 DIAGNOSIS — E782 Mixed hyperlipidemia: Secondary | ICD-10-CM | POA: Diagnosis not present

## 2018-09-10 DIAGNOSIS — D649 Anemia, unspecified: Secondary | ICD-10-CM | POA: Diagnosis not present

## 2018-09-10 DIAGNOSIS — E782 Mixed hyperlipidemia: Secondary | ICD-10-CM | POA: Diagnosis not present

## 2018-09-10 DIAGNOSIS — I10 Essential (primary) hypertension: Secondary | ICD-10-CM | POA: Diagnosis not present

## 2018-09-10 DIAGNOSIS — E559 Vitamin D deficiency, unspecified: Secondary | ICD-10-CM | POA: Diagnosis not present

## 2018-09-10 DIAGNOSIS — N920 Excessive and frequent menstruation with regular cycle: Secondary | ICD-10-CM | POA: Diagnosis not present

## 2018-09-10 DIAGNOSIS — E1165 Type 2 diabetes mellitus with hyperglycemia: Secondary | ICD-10-CM | POA: Diagnosis not present

## 2018-09-23 DIAGNOSIS — S83231A Complex tear of medial meniscus, current injury, right knee, initial encounter: Secondary | ICD-10-CM | POA: Diagnosis not present

## 2018-09-23 DIAGNOSIS — M11261 Other chondrocalcinosis, right knee: Secondary | ICD-10-CM | POA: Diagnosis not present

## 2018-09-23 DIAGNOSIS — M1711 Unilateral primary osteoarthritis, right knee: Secondary | ICD-10-CM | POA: Diagnosis not present

## 2018-09-23 DIAGNOSIS — M948X6 Other specified disorders of cartilage, lower leg: Secondary | ICD-10-CM | POA: Diagnosis not present

## 2018-09-23 DIAGNOSIS — Y999 Unspecified external cause status: Secondary | ICD-10-CM | POA: Diagnosis not present

## 2018-09-23 DIAGNOSIS — X58XXXA Exposure to other specified factors, initial encounter: Secondary | ICD-10-CM | POA: Diagnosis not present

## 2018-09-23 DIAGNOSIS — S83261A Peripheral tear of lateral meniscus, current injury, right knee, initial encounter: Secondary | ICD-10-CM | POA: Diagnosis not present

## 2018-10-11 DIAGNOSIS — M25662 Stiffness of left knee, not elsewhere classified: Secondary | ICD-10-CM | POA: Diagnosis not present

## 2018-10-15 DIAGNOSIS — M25662 Stiffness of left knee, not elsewhere classified: Secondary | ICD-10-CM | POA: Diagnosis not present

## 2018-10-17 ENCOUNTER — Other Ambulatory Visit: Payer: Self-pay | Admitting: Nurse Practitioner

## 2018-10-17 DIAGNOSIS — M25662 Stiffness of left knee, not elsewhere classified: Secondary | ICD-10-CM | POA: Diagnosis not present

## 2018-10-17 DIAGNOSIS — N950 Postmenopausal bleeding: Secondary | ICD-10-CM | POA: Diagnosis not present

## 2018-10-17 DIAGNOSIS — N920 Excessive and frequent menstruation with regular cycle: Secondary | ICD-10-CM | POA: Diagnosis not present

## 2018-10-22 DIAGNOSIS — M25662 Stiffness of left knee, not elsewhere classified: Secondary | ICD-10-CM | POA: Diagnosis not present

## 2018-10-24 DIAGNOSIS — M25662 Stiffness of left knee, not elsewhere classified: Secondary | ICD-10-CM | POA: Diagnosis not present

## 2018-10-29 DIAGNOSIS — M25662 Stiffness of left knee, not elsewhere classified: Secondary | ICD-10-CM | POA: Diagnosis not present

## 2018-10-31 DIAGNOSIS — M25662 Stiffness of left knee, not elsewhere classified: Secondary | ICD-10-CM | POA: Diagnosis not present

## 2018-11-05 DIAGNOSIS — M25662 Stiffness of left knee, not elsewhere classified: Secondary | ICD-10-CM | POA: Diagnosis not present

## 2019-01-22 ENCOUNTER — Encounter: Payer: Self-pay | Admitting: *Deleted

## 2019-01-24 ENCOUNTER — Ambulatory Visit: Payer: Self-pay | Admitting: Diagnostic Neuroimaging

## 2019-06-11 ENCOUNTER — Other Ambulatory Visit: Payer: Self-pay

## 2019-06-11 DIAGNOSIS — Z20822 Contact with and (suspected) exposure to covid-19: Secondary | ICD-10-CM

## 2019-06-18 LAB — NOVEL CORONAVIRUS, NAA: SARS-CoV-2, NAA: NOT DETECTED

## 2020-06-18 ENCOUNTER — Ambulatory Visit (HOSPITAL_COMMUNITY)
Admission: EM | Admit: 2020-06-18 | Discharge: 2020-06-18 | Disposition: A | Discharge status: Home / Self Care | Payer: 59 | Attending: Family Medicine | Admitting: Family Medicine

## 2020-06-18 ENCOUNTER — Encounter (HOSPITAL_COMMUNITY): Payer: Self-pay

## 2020-06-18 ENCOUNTER — Other Ambulatory Visit: Payer: Self-pay

## 2020-06-18 DIAGNOSIS — M5431 Sciatica, right side: Secondary | ICD-10-CM | POA: Diagnosis not present

## 2020-06-18 MED ORDER — METHYLPREDNISOLONE ACETATE 40 MG/ML IJ SUSP
INTRAMUSCULAR | Status: AC
  Filled 2020-06-18: qty 1

## 2020-06-18 MED ORDER — MELOXICAM 7.5 MG PO TABS: 7.5000 mg | ORAL_TABLET | Freq: Every day | ORAL | 0 refills | Status: DC

## 2020-06-18 MED ORDER — METHYLPREDNISOLONE ACETATE 40 MG/ML IJ SUSP
40.0000 mg | Freq: Once | INTRAMUSCULAR | Status: AC
  Administered 2020-06-18: 40 mg via INTRAMUSCULAR

## 2020-06-18 NOTE — Discharge Instructions (Signed)
Light and regular activity as tolerated.  See exercises provided.  Sleep with pillow under your knees.   Monitor your blood sugar over the next week.  Meloxicam daily, take with food. Don't take additional ibuprofen or aleve.  Follow up with your PCP and/or sports medicine as needed for persistent symptoms.

## 2020-06-18 NOTE — ED Provider Notes (Signed)
[facility]    CSN: [id number] Arrival date & time: 06/18/20  0901      History   Chief Complaint Chief Complaint  Patient presents with  . Hip Pain    HPI Destiny Alexander is a 59 y.o. female.   Destiny Alexander presents with complaints of right posterior buttocks and thigh pain which started two days ago, worsened yesterday after carrying groceries. Worse with sitting, bending, getting in and out of her car. No numbness tingling or weakness. Low back injury in May but no other history of back pain. Diabetic. No urinary symptoms. No drug use. No fevers. Ambulatory. Has taken flexeril and ibuprofen which have not helped.   ROS per HPI, negative if not otherwise mentioned.      Past Medical History:  Diagnosis Date  . Diabetes mellitus   . Hypertension     Patient Active Problem List   Diagnosis Date Noted  . Post concussion syndrome 01/08/2017  . Type 2 diabetes mellitus without complication, with long-term current use of insulin (HCC) 01/08/2017  . Essential hypertension 01/08/2017  . BMI 50.0-59.9, adult (HCC) 01/08/2017  . 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 APPENDECTOMY    . CESAREAN SECTION     x 2  . CHOLECYSTECTOMY    . LIVER SURGERY     "piece of liver removed w/choley"  . TUBAL LIGATION      OB History   No obstetric history on file.      Home Medications    Prior to Admission medications   Medication Sig Start Date End Date Taking? Authorizing Provider  losartan (COZAAR) 50 MG tablet Take 50 mg by mouth daily.   Yes [provider]  cyclobenzaprine (FLEXERIL) 10 MG tablet 10 mg as needed. 12/29/16   [provider]  glimepiride (AMARYL) 4 MG tablet Take 4 mg by mouth daily before breakfast.      [provider]  ibuprofen (ADVIL,MOTRIN) 600 MG tablet Take 1 tablet (600 mg total) by mouth every 6 (six) hours as needed. 10/31/16    Bethel Born, PA-C  Insulin Glargine (TOUJEO SOLOSTAR [place]) Inject into the skin.    [provider]  levocetirizine (XYZAL) 5 MG tablet Take 5 mg by mouth every evening.    [provider]  Liraglutide (VICTOZA [place]) Inject into the skin.    [provider]  meloxicam (MOBIC) 7.5 MG tablet Take 1 tablet (7.5 mg total) by mouth daily. 06/18/20   Georgetta Haber, NP  naproxen (NAPROSYN) 500 MG tablet Take 500 mg by mouth 2 (two) times daily with a meal.    [provider]  pravastatin (PRAVACHOL) 10 MG tablet Take 10 mg by mouth daily.    [provider]  traMADol (ULTRAM) 50 MG tablet Take by mouth every 6 (six) hours as needed.    [provider]    Family History Family History  Problem Relation Age of Onset  . CVA Mother   . Diabetes Mother   . Hypertension Mother   . Arthritis/Rheumatoid Mother   . Heart failure Father   . Diabetes Father   . Hypertension Father   . Diabetes Brother   . Hypertension Sister     Social History Social History   Tobacco Use  . Smoking status: Former Smoker    Start date: 11/24/2016  . Smokeless tobacco: Never Used  . Tobacco comment:  quit 1985  Substance Use Topics  . Alcohol use: No    Comment: quit 1985  . Drug use: No     Allergies   Pioglitazone, Canagliflozin, Ace inhibitors, Amoxicillin, Metformin and related, and Penicillins   Review of Systems Review of Systems   Physical Exam Triage Vital Signs ED Triage Vitals  Enc Vitals Group     BP 06/18/20 0938 137/83     Pulse Rate 06/18/20 0938 98     Resp 06/18/20 0938 18     Temp 06/18/20 0938 98.4 F (36.9 C)     Temp Source 06/18/20 0938 Oral     SpO2 06/18/20 0938 99 %     Weight 06/18/20 0939 291 lb (132 kg)     Height 06/18/20 0939 5\' 5"  (1.651 m)     Head Circumference --      Peak Flow --      Pain Score 06/18/20 0939 10     Pain Loc --      Pain Edu? --      Excl. in GC? --    No data found.  Updated  Vital Signs BP 137/83   Pulse 98   Temp 98.4 F (36.9 C) (Oral)   Resp 18   Ht 5\' 5"  (1.651 m)   Wt 291 lb (132 kg)   SpO2 99%   BMI 48.42 kg/m   Visual Acuity Right Eye Distance:   Left Eye Distance:   Bilateral Distance:    Right Eye Near:   Left Eye Near:    Bilateral Near:     Physical Exam Constitutional:      General: She is not in acute distress.    Appearance: She is well-developed. She is obese.  Cardiovascular:     Rate and Rhythm: Normal rate.  Pulmonary:     Effort: Pulmonary effort is normal.  Musculoskeletal:     Lumbar back: Tenderness present. No edema, spasms or bony tenderness. Positive right straight leg raise test.     Comments: pain with transition from sit to lay and lay to sit; right low back with tenderness; mild right inner thigh pain with log roll of right leg; strength equal bilaterally; gross sensation intact ; no pain to hip palpation or with hip flexion  Skin:    General: Skin is warm and dry.  Neurological:     Mental Status: She is alert and oriented to person, place, and time.      UC Treatments / Results  Labs (all labs ordered are listed, but only abnormal results are displayed) Labs Reviewed - No data to display  EKG   Radiology No results found.  Procedures Procedures (including critical care time)  Medications Ordered in UC Medications  methylPREDNISolone acetate (DEPO-MEDROL) injection 40 mg (has no administration in time range)    Initial Impression / Assessment and Plan / UC Course  I have reviewed the triage vital signs and the nursing notes.  Pertinent labs & imaging results that were available during my care of the patient were reviewed by me and considered in my medical decision making (see chart for details).     Exam consistent with sciatica. Diabetic, opted to provide im depo medrol x1 40mg  today followed by meloxicam to help alleviate pain. Encouraged to monitor blood sugars. Return precautions provided  and follow up discussed. Patient verbalized understanding and agreeable to plan.   Final Clinical Impressions(s) / UC Diagnoses   Final diagnoses:  Sciatica of right side  Discharge Instructions     Light and regular activity as tolerated.  See exercises provided.  Sleep with pillow under your knees.   Monitor your blood sugar over the next week.  Meloxicam daily, take with food. Don't take additional ibuprofen or aleve.  Follow up with your PCP and/or sports medicine as needed for persistent symptoms.    ED Prescriptions    Medication Sig Dispense Auth. Provider   meloxicam (MOBIC) 7.5 MG tablet Take 1 tablet (7.5 mg total) by mouth daily. 20 tablet Georgetta Haber, NP     PDMP not reviewed this encounter.   Georgetta Haber, NP 06/18/20 1010

## 2020-06-18 NOTE — ED Triage Notes (Signed)
Pt c/o 10/10 burning right hip painx2 days. PT denies injury. PT states she was moving light boxes 2 days ago. Pt was able to walk to exam room. Pt got up very slowly from chair.

## 2020-08-02 ENCOUNTER — Ambulatory Visit
Admission: EM | Admit: 2020-08-02 | Discharge: 2020-08-02 | Disposition: A | Discharge status: Home / Self Care | Payer: 59 | Attending: Family Medicine | Admitting: Family Medicine

## 2020-08-02 ENCOUNTER — Other Ambulatory Visit: Payer: Self-pay

## 2020-08-02 DIAGNOSIS — Z20822 Contact with and (suspected) exposure to covid-19: Secondary | ICD-10-CM

## 2020-08-02 NOTE — ED Triage Notes (Signed)
Pt presents with complaints of needing covid test. Denies any symptoms. Reports having a covid test done due to exposure. Reports the health department asked her to come back to be retested due to the date of exposure.

## 2020-08-02 NOTE — Discharge Instructions (Signed)

## 2020-08-03 LAB — NOVEL CORONAVIRUS, NAA: SARS-CoV-2, NAA: NOT DETECTED

## 2020-08-03 LAB — SARS-COV-2, NAA 2 DAY TAT

## 2020-09-07 ENCOUNTER — Other Ambulatory Visit: Payer: Self-pay | Admitting: Family Medicine

## 2020-09-07 DIAGNOSIS — Z1231 Encounter for screening mammogram for malignant neoplasm of breast: Secondary | ICD-10-CM

## 2020-09-08 ENCOUNTER — Other Ambulatory Visit: Payer: Self-pay

## 2020-09-08 ENCOUNTER — Ambulatory Visit
Admission: RE | Admit: 2020-09-08 | Discharge: 2020-09-08 | Disposition: A | Discharge status: Home / Self Care | Payer: 59 | Source: Ambulatory Visit | Attending: Family Medicine | Admitting: Family Medicine

## 2020-09-08 DIAGNOSIS — Z1231 Encounter for screening mammogram for malignant neoplasm of breast: Secondary | ICD-10-CM

## 2020-10-02 ENCOUNTER — Ambulatory Visit
Admission: EM | Admit: 2020-10-02 | Discharge: 2020-10-02 | Disposition: A | Discharge status: Home / Self Care | Payer: 59 | Attending: Emergency Medicine | Admitting: Emergency Medicine

## 2020-10-02 ENCOUNTER — Other Ambulatory Visit: Payer: Self-pay

## 2020-10-02 DIAGNOSIS — Z20822 Contact with and (suspected) exposure to covid-19: Secondary | ICD-10-CM

## 2020-10-02 NOTE — ED Triage Notes (Signed)
Pt here for covid testing after covid exposure last Tuesday; denies sx

## 2020-10-03 LAB — NOVEL CORONAVIRUS, NAA: SARS-CoV-2, NAA: NOT DETECTED

## 2020-10-03 LAB — SARS-COV-2, NAA 2 DAY TAT

## 2020-12-10 ENCOUNTER — Encounter (HOSPITAL_COMMUNITY): Payer: Self-pay | Admitting: Emergency Medicine

## 2020-12-10 ENCOUNTER — Other Ambulatory Visit: Payer: Self-pay

## 2020-12-10 ENCOUNTER — Emergency Department (HOSPITAL_COMMUNITY)
Admission: EM | Admit: 2020-12-10 | Discharge: 2020-12-11 | Disposition: A | Discharge status: Home / Self Care | Payer: 59 | Attending: Emergency Medicine | Admitting: Emergency Medicine

## 2020-12-10 ENCOUNTER — Emergency Department (HOSPITAL_COMMUNITY): Payer: 59

## 2020-12-10 ENCOUNTER — Ambulatory Visit
Admission: EM | Admit: 2020-12-10 | Discharge: 2020-12-10 | Disposition: A | Discharge status: Home / Self Care | Payer: 59 | Attending: Emergency Medicine | Admitting: Emergency Medicine

## 2020-12-10 DIAGNOSIS — J069 Acute upper respiratory infection, unspecified: Secondary | ICD-10-CM

## 2020-12-10 DIAGNOSIS — Z20822 Contact with and (suspected) exposure to covid-19: Secondary | ICD-10-CM

## 2020-12-10 DIAGNOSIS — R0602 Shortness of breath: Secondary | ICD-10-CM | POA: Diagnosis present

## 2020-12-10 DIAGNOSIS — Z79899 Other long term (current) drug therapy: Secondary | ICD-10-CM | POA: Insufficient documentation

## 2020-12-10 DIAGNOSIS — R000 Tachycardia, unspecified: Secondary | ICD-10-CM

## 2020-12-10 DIAGNOSIS — Z1152 Encounter for screening for COVID-19: Secondary | ICD-10-CM | POA: Diagnosis not present

## 2020-12-10 DIAGNOSIS — Z7984 Long term (current) use of oral hypoglycemic drugs: Secondary | ICD-10-CM | POA: Insufficient documentation

## 2020-12-10 DIAGNOSIS — E119 Type 2 diabetes mellitus without complications: Secondary | ICD-10-CM | POA: Diagnosis not present

## 2020-12-10 DIAGNOSIS — U071 COVID-19: Secondary | ICD-10-CM | POA: Insufficient documentation

## 2020-12-10 DIAGNOSIS — I4892 Unspecified atrial flutter: Secondary | ICD-10-CM | POA: Insufficient documentation

## 2020-12-10 DIAGNOSIS — Z794 Long term (current) use of insulin: Secondary | ICD-10-CM | POA: Insufficient documentation

## 2020-12-10 DIAGNOSIS — Z87891 Personal history of nicotine dependence: Secondary | ICD-10-CM | POA: Diagnosis not present

## 2020-12-10 DIAGNOSIS — I10 Essential (primary) hypertension: Secondary | ICD-10-CM | POA: Diagnosis not present

## 2020-12-10 DIAGNOSIS — R0902 Hypoxemia: Secondary | ICD-10-CM

## 2020-12-10 LAB — CBC WITH DIFFERENTIAL/PLATELET
Abs Immature Granulocytes: 0.03 10*3/uL (ref 0.00–0.07)
Basophils Absolute: 0 10*3/uL (ref 0.0–0.1)
Basophils Relative: 0 %
Eosinophils Absolute: 0 10*3/uL (ref 0.0–0.5)
Eosinophils Relative: 0 %
HCT: 36 % (ref 36.0–46.0)
Hemoglobin: 11.5 g/dL — ABNORMAL LOW (ref 12.0–15.0)
Immature Granulocytes: 0 %
Lymphocytes Relative: 9 %
Lymphs Abs: 0.8 10*3/uL (ref 0.7–4.0)
MCH: 25.7 pg — ABNORMAL LOW (ref 26.0–34.0)
MCHC: 31.9 g/dL (ref 30.0–36.0)
MCV: 80.5 fL (ref 80.0–100.0)
Monocytes Absolute: 0.3 10*3/uL (ref 0.1–1.0)
Monocytes Relative: 3 %
Neutro Abs: 8.4 10*3/uL — ABNORMAL HIGH (ref 1.7–7.7)
Neutrophils Relative %: 88 %
Platelets: 262 10*3/uL (ref 150–400)
RBC: 4.47 MIL/uL (ref 3.87–5.11)
RDW: 16 % — ABNORMAL HIGH (ref 11.5–15.5)
WBC: 9.6 10*3/uL (ref 4.0–10.5)
nRBC: 0 % (ref 0.0–0.2)

## 2020-12-10 LAB — BASIC METABOLIC PANEL
Anion gap: 15 (ref 5–15)
BUN: 11 mg/dL (ref 6–20)
CO2: 20 mmol/L — ABNORMAL LOW (ref 22–32)
Calcium: 8.8 mg/dL — ABNORMAL LOW (ref 8.9–10.3)
Chloride: 103 mmol/L (ref 98–111)
Creatinine, Ser: 0.95 mg/dL (ref 0.44–1.00)
GFR, Estimated: >60 mL/min (ref >60)
Glucose, Bld: 241 mg/dL — ABNORMAL HIGH (ref 70–99)
Potassium: 3.8 mmol/L (ref 3.5–5.1)
Sodium: 138 mmol/L (ref 135–145)

## 2020-12-10 LAB — TSH: TSH: 0.522 u[IU]/mL (ref 0.350–4.500)

## 2020-12-10 LAB — RESP PANEL BY RT-PCR (FLU A&B, COVID) ARPGX2
Influenza A by PCR: NEGATIVE
Influenza B by PCR: NEGATIVE
SARS Coronavirus 2 by RT PCR: POSITIVE — AB

## 2020-12-10 LAB — BRAIN NATRIURETIC PEPTIDE: B Natriuretic Peptide: 39.6 pg/mL (ref 0.0–100.0)

## 2020-12-10 MED ORDER — IOHEXOL 350 MG/ML SOLN
75.0000 mL | Freq: Once | INTRAVENOUS | Status: AC | PRN
  Administered 2020-12-10: 75 mL via INTRAVENOUS

## 2020-12-10 MED ORDER — SODIUM CHLORIDE 0.9 % IV BOLUS
1000.0000 mL | Freq: Once | INTRAVENOUS | Status: AC
  Administered 2020-12-10: 1000 mL via INTRAVENOUS

## 2020-12-10 MED ORDER — LACTATED RINGERS IV BOLUS
1000.0000 mL | Freq: Once | INTRAVENOUS | Status: AC
  Administered 2020-12-10: 1000 mL via INTRAVENOUS

## 2020-12-10 MED ORDER — ADENOSINE 6 MG/2ML IV SOLN
6.0000 mg | Freq: Once | INTRAVENOUS | Status: DC
  Filled 2020-12-10: qty 2

## 2020-12-10 MED ORDER — BENZONATATE 100 MG PO CAPS: 100.0000 mg | ORAL_CAPSULE | Freq: Three times a day (TID) | ORAL | 0 refills | Status: DC

## 2020-12-10 MED ORDER — ACETAMINOPHEN 500 MG PO TABS
1000.0000 mg | ORAL_TABLET | Freq: Once | ORAL | Status: AC
  Administered 2020-12-10: 1000 mg via ORAL
  Filled 2020-12-10: qty 2

## 2020-12-10 NOTE — Discharge Instructions (Signed)
Please read and follow all provided instructions.  Your diagnoses today include:  1. COVID-19   2. Atrial flutter with rapid ventricular response (HCC)     Tests performed today include:  COVID test - POSITIVE for covid  Chest x-ray - no significant PNA  EKG - showed fast heart rate which will need to be further evaluated by a cardiologist Blood cell counts (white, red, and platelets) Electrolytes  Kidney function test  Vital signs. See below for your results today.   Medications prescribed:   Tessalon Perles - cough suppressant medication  Take any prescribed medications only as directed.  Home care instructions:  Follow any educational materials contained in this packet.  BE VERY CAREFUL not to take multiple medicines containing Tylenol (also called acetaminophen). Doing so can lead to an overdose which can damage your liver and cause liver failure and possibly death.   Follow-up instructions: Please follow-up with the cardiologist listed once you have recovered from your COVID infection.   Return instructions:   Please return to the Emergency Department if you experience worsening symptoms.   Return with worsening trouble breathing, shortness of breath, chest pain, persistent vomiting.  Please return if you have any other emergent concerns.  Additional Information:  Your vital signs today were: BP 136/81   Pulse (!) 122   Temp 98.7 F (37.1 C) (Oral)   Resp 16   SpO2 97%  If your blood pressure (BP) was elevated above 135/85 this visit, please have this repeated by your doctor within one month. --------------

## 2020-12-10 NOTE — ED Triage Notes (Signed)
Patient BIB [facility] for shortness of breath that started 12/03/2020 room air sat 97%. . No known exposure to COVID, received two doses of pfizer.   EMS Vitals BP 157/94 HR 140 RR 21 CBG 291

## 2020-12-10 NOTE — ED Provider Notes (Signed)
[facility]    CSN: [id number] Arrival date & time: 12/10/20  7829      History   Chief Complaint Chief Complaint  Patient presents with  . Shortness of Breath    Last friday  . Cough    Last Friday     HPI Destiny Alexander is a 59 y.o. female  History of diabetes, hypertension, obesity presenting for weeklong course of URI symptoms.  Endorsing cough, congestion since last Friday.  States since about Monday she has been having increased chest tightness and pain as well as dyspnea.  No known Covid exposures.  Received second Covid vaccine around Thanksgiving.  Past Medical History:  Diagnosis Date  . Diabetes mellitus   . Hypertension     Patient Active Problem List   Diagnosis Date Noted  . Post concussion syndrome 01/08/2017  . Type 2 diabetes mellitus without complication, with long-term current use of insulin (HCC) 01/08/2017  . Essential hypertension 01/08/2017  . BMI 50.0-59.9, adult (HCC) 01/08/2017  . 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 APPENDECTOMY    . CESAREAN SECTION     x 2  . CHOLECYSTECTOMY    . LIVER SURGERY     "piece of liver removed w/choley"  . TUBAL LIGATION      OB History   No obstetric history on file.      Home Medications    Prior to Admission medications   Medication Sig Start Date End Date Taking? Authorizing Provider  cyclobenzaprine (FLEXERIL) 10 MG tablet 10 mg as needed. 12/29/16   [provider]  glimepiride (AMARYL) 4 MG tablet Take 4 mg by mouth daily before breakfast.      [provider]  ibuprofen (ADVIL,MOTRIN) 600 MG tablet Take 1 tablet (600 mg total) by mouth every 6 (six) hours as needed. 10/31/16   Bethel Born, PA-C  Insulin Glargine (TOUJEO SOLOSTAR [place]) Inject into the skin.    [provider]  levocetirizine (XYZAL) 5 MG tablet Take 5 mg by mouth every evening.    [provider]  Liraglutide (VICTOZA [place]) Inject into the skin.    [provider]  losartan (COZAAR) 50 MG tablet Take 50 mg by mouth daily.    [provider]  meloxicam (MOBIC) 7.5 MG tablet Take 1 tablet (7.5 mg total) by mouth daily. 06/18/20   Georgetta Haber, NP  naproxen (NAPROSYN) 500 MG tablet Take 500 mg by mouth 2 (two) times daily with a meal.    [provider]  pravastatin (PRAVACHOL) 10 MG tablet Take 10 mg by mouth daily.    [provider]  traMADol (ULTRAM) 50 MG tablet Take by mouth every 6 (six) hours as needed.    [provider]    Family History Family History  Problem Relation Age of Onset  . CVA Mother   . Diabetes Mother   . Hypertension Mother   . Arthritis/Rheumatoid Mother   . Heart failure Father   . Diabetes Father   . Hypertension Father   . Diabetes Brother   . Hypertension Sister     Social History Social History   Tobacco Use  . Smoking status: Former Smoker    Start date: 11/24/2016  . Smokeless tobacco: Never Used  . Tobacco comment: quit 1985  Substance Use Topics  . Alcohol use: No    Comment: quit 1985  . Drug  use: No     Allergies   Pioglitazone, Canagliflozin, Ace inhibitors, Amoxicillin, Metformin and related, and Penicillins   Review of Systems Review of Systems  Constitutional: Positive for appetite change and fatigue. Negative for fever.  HENT: Positive for congestion. Negative for dental problem, ear pain, facial swelling, hearing loss, sinus pain, sore throat, trouble swallowing and voice change.   Eyes: Negative for photophobia, pain and visual disturbance.  Respiratory: Positive for cough, chest tightness and shortness of breath.   Cardiovascular: Positive for chest pain. Negative for palpitations.  Gastrointestinal: Negative for diarrhea and vomiting.  Musculoskeletal: Negative for arthralgias and myalgias.  Neurological: Negative for dizziness and headaches.      Physical Exam Triage Vital Signs ED Triage Vitals [12/10/20 0954]  Enc Vitals Group     BP (!) 142/95     Pulse Rate (!) 141     Resp (!) 22     Temp 99.9 F (37.7 C)     Temp Source Oral     SpO2 91 %     Weight      Height      Head Circumference      Peak Flow      Pain Score 8     Pain Loc      Pain Edu?      Excl. in GC?    No data found.  Updated Vital Signs BP (!) 142/95 (BP Location: Left Arm)   Pulse (!) 141   Temp 99.9 F (37.7 C) (Oral)   Resp (!) 22   SpO2 91%   Visual Acuity Right Eye Distance:   Left Eye Distance:   Bilateral Distance:    Right Eye Near:   Left Eye Near:    Bilateral Near:     Physical Exam Constitutional:      General: She is not in acute distress.    Appearance: She is well-developed. She is obese. She is ill-appearing. She is not toxic-appearing or diaphoretic.  HENT:     Head: Normocephalic and atraumatic.     Right Ear: Tympanic membrane and ear canal normal.     Left Ear: Tympanic membrane and ear canal normal.     Mouth/Throat:     Mouth: Mucous membranes are moist.     Pharynx: Oropharynx is clear. No oropharyngeal exudate or posterior oropharyngeal erythema.  Eyes:     General: No scleral icterus.    Conjunctiva/sclera: Conjunctivae normal.     Pupils: Pupils are equal, round, and reactive to light.  Neck:     Vascular: No JVD.     Trachea: No tracheal deviation.     Comments: Trachea midline, negative JVD Cardiovascular:     Rate and Rhythm: Regular rhythm. Tachycardia present.     Heart sounds: No murmur heard. No gallop.   Pulmonary:     Effort: Pulmonary effort is normal. Tachypnea present. No accessory muscle usage or respiratory distress.     Breath sounds: Decreased breath sounds present. No wheezing, rhonchi or rales.  Musculoskeletal:     Cervical back: Neck supple. No tenderness.  Lymphadenopathy:     Cervical: No cervical adenopathy.  Skin:    Capillary Refill: Capillary refill takes less  than 2 seconds.     Coloration: Skin is not jaundiced or pale.     Findings: No rash.  Neurological:     General: No focal deficit present.     Mental Status: She is alert and oriented to person, place, and time.  UC Treatments / Results  Labs (all labs ordered are listed, but only abnormal results are displayed) Labs Reviewed  NOVEL CORONAVIRUS, NAA    EKG   Radiology No results found.  Procedures Procedures (including critical care time)  Medications Ordered in UC Medications - No data to display  Initial Impression / Assessment and Plan / UC Course  I have reviewed the triage vital signs and the nursing notes.  Pertinent labs & imaging results that were available during my care of the patient were reviewed by me and considered in my medical decision making (see chart for details).     EKG done office, reviewed by me and compared to previous from 08/24/2016: Sinus tachycardia with ventricular rate 140 bpm.  No QTC prolongation.  Patient does have T wave inversion in inferior leads without reciprocal changes.  Patient is tachypneic, tachycardic, with slight pallor and diaphoresis.  Covid test pending, though given clinical presentation, comorbidities, and likelihood of acute Covid infection, recommended ER for further evaluation and management.  Transported in stable condition via EMS. Final Clinical Impressions(s) / UC Diagnoses   Final diagnoses:  Encounter for screening for COVID-19  Suspected COVID-19 virus infection  URI with cough and congestion  Tachycardia  SOB (shortness of breath)  Hypoxia   Discharge Instructions   None    ED Prescriptions    None     PDMP not reviewed this encounter.   [place], [place], [place] 12/10/20 1025

## 2020-12-10 NOTE — ED Provider Notes (Signed)
[facility] EMERGENCY DEPARTMENT Provider Note   CSN: [id number] Arrival date & time: 12/10/20  1142     History Chief Complaint  Patient presents with  . Shortness of Breath    Destiny Alexander is a 59 y.o. female.  Patient with history of diabetes and hypertension presents the emergency department today for evaluation of respiratory illness.  [facility] prior to arrival today.  Patient is on day 8 of illness.  She began having congestion type symptoms which then worsened to a cough.  Patient has had productive cough.  She has had sweats at night but no documented fevers.  She has chest pain with coughing only.  No nausea, vomiting, or diarrhea.  She was found to be tachycardic at urgent care and was transported to the hospital by EMS.  Heart rate 140-160.  Appears to be sinus tachycardia.  Patient denies history of thyroid disease.  She has received 2 doses of Pfizer Covid vaccine.  No known sick contacts.        Past Medical History:  Diagnosis Date  . Diabetes mellitus   . Hypertension     Patient Active Problem List   Diagnosis Date Noted  . Post concussion syndrome 01/08/2017  . Type 2 diabetes mellitus without complication, with long-term current use of insulin (HCC) 01/08/2017  . Essential hypertension 01/08/2017  . BMI 50.0-59.9, adult (HCC) 01/08/2017  . 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 APPENDECTOMY    . CESAREAN SECTION     x 2  . CHOLECYSTECTOMY    . LIVER SURGERY     "piece of liver removed w/choley"  . TUBAL LIGATION       OB History   No obstetric history on file.     Family History  Problem Relation Age of Onset  . CVA Mother   . Diabetes Mother   . Hypertension Mother   . Arthritis/Rheumatoid Mother   . Heart failure Father   . Diabetes Father   . Hypertension Father   . Diabetes Brother   . Hypertension  Sister     Social History   Tobacco Use  . Smoking status: Former Smoker    Start date: 11/24/2016  . Smokeless tobacco: Never Used  . Tobacco comment: quit 1985  Substance Use Topics  . Alcohol use: No    Comment: quit 1985  . Drug use: No    Home Medications Prior to Admission medications   Medication Sig Start Date End Date Taking? Authorizing Provider  cyclobenzaprine (FLEXERIL) 10 MG tablet 10 mg as needed. 12/29/16   [provider]  glimepiride (AMARYL) 4 MG tablet Take 4 mg by mouth daily before breakfast.      [provider]  ibuprofen (ADVIL,MOTRIN) 600 MG tablet Take 1 tablet (600 mg total) by mouth every 6 (six) hours as needed. 10/31/16   Bethel Born, PA-C  Insulin Glargine (TOUJEO SOLOSTAR [place]) Inject into the skin.    [provider]  levocetirizine (XYZAL) 5 MG tablet Take 5 mg by mouth every evening.    [provider]  Liraglutide (VICTOZA [place]) Inject into the skin.    [provider]  losartan (COZAAR) 50 MG tablet Take 50 mg by mouth daily.    [provider]  meloxicam (MOBIC) 7.5 MG tablet Take 1 tablet (7.5 mg total) by mouth daily. 06/18/20  Georgetta Haber, NP  naproxen (NAPROSYN) 500 MG tablet Take 500 mg by mouth 2 (two) times daily with a meal.    [provider]  pravastatin (PRAVACHOL) 10 MG tablet Take 10 mg by mouth daily.    [provider]  traMADol (ULTRAM) 50 MG tablet Take by mouth every 6 (six) hours as needed.    [provider]    Allergies    Pioglitazone, Canagliflozin, Ace inhibitors, Amoxicillin, Metformin and related, and Penicillins  Review of Systems   Review of Systems  Constitutional: Positive for chills and diaphoresis. Negative for fever.  HENT: Positive for congestion. Negative for rhinorrhea and sore throat.   Eyes: Negative for redness.  Respiratory: Positive for cough and shortness of breath.   Cardiovascular: Positive for chest pain  (with cough). Negative for leg swelling.  Gastrointestinal: Negative for abdominal pain, diarrhea, nausea and vomiting.  Genitourinary: Negative for dysuria, frequency, hematuria and urgency.  Musculoskeletal: Negative for myalgias.  Skin: Negative for rash.  Neurological: Negative for headaches.    Physical Exam Updated Vital Signs BP (!) 143/79 (BP Location: Right Arm)   Pulse (!) 159   Temp 98.7 F (37.1 C) (Oral)   Resp (!) 24   SpO2 98%   Physical Exam Vitals and nursing note reviewed.  Constitutional:      General: She is not in acute distress.    Appearance: She is well-developed.  HENT:     Head: Normocephalic and atraumatic.     Right Ear: External ear normal.     Left Ear: External ear normal.     Nose: Nose normal.  Eyes:     Conjunctiva/sclera: Conjunctivae normal.  Cardiovascular:     Rate and Rhythm: Regular rhythm. Tachycardia present.     Heart sounds: No murmur heard.   Pulmonary:     Effort: Tachypnea present. No respiratory distress.     Breath sounds: Decreased breath sounds present. No wheezing, rhonchi or rales.  Abdominal:     Palpations: Abdomen is soft.     Tenderness: There is no abdominal tenderness. There is no guarding or rebound.  Musculoskeletal:     Cervical back: Normal range of motion and neck supple.     Right lower leg: No edema.     Left lower leg: No edema.  Skin:    General: Skin is warm and dry.     Findings: No rash.  Neurological:     General: No focal deficit present.     Mental Status: She is alert. Mental status is at baseline.     Motor: No weakness.  Psychiatric:        Mood and Affect: Mood normal.     ED Results / Procedures / Treatments   Labs (all labs ordered are listed, but only abnormal results are displayed) Labs Reviewed  RESP PANEL BY RT-PCR (FLU A&B, COVID) ARPGX2 - Abnormal; Notable for the following components:      Result Value   SARS Coronavirus 2 by RT PCR POSITIVE (*)    All other components  within normal limits  CBC WITH DIFFERENTIAL/PLATELET - Abnormal; Notable for the following components:   Hemoglobin 11.5 (*)    MCH 25.7 (*)    RDW 16.0 (*)    Neutro Abs 8.4 (*)    All other components within normal limits  BASIC METABOLIC PANEL - Abnormal; Notable for the following components:   CO2 20 (*)    Glucose, Bld 241 (*)  Calcium 8.8 (*)    All other components within normal limits  BRAIN NATRIURETIC PEPTIDE  TSH    ED ECG REPORT   Date: 12/10/2020  Rate: 160  Rhythm: atrial flutter?   QRS Axis: normal  Intervals: QT prolonged  ST/T Wave abnormalities: normal  Conduction Disutrbances:none  Narrative Interpretation:   Old EKG Reviewed: changes noted  I have personally reviewed the EKG tracing and disagree with the computerized printout as noted.  Radiology DG Chest Port 1 View  Result Date: 12/10/2020 CLINICAL DATA:  Cough, SOB, tachy, had pt remove earphones she did not remove necklace EXAM: PORTABLE CHEST 1 VIEW COMPARISON:  11/21/7 FINDINGS: Cardiac silhouette normal in size.  No mediastinal or hilar masses. Clear lungs.  No convincing pleural effusion and no pneumothorax. Skeletal structures are grossly intact. IMPRESSION: No active disease. Electronically Signed   By: Amie Portland M.D.   On: 12/10/2020 12:31    Procedures Procedures (including critical care time)  Medications Ordered in ED Medications  adenosine (ADENOCARD) 6 MG/2ML injection 6 mg (has no administration in time range)    ED Course  I have reviewed the triage vital signs and the nursing notes.  Pertinent labs & imaging results that were available during my care of the patient were reviewed by me and considered in my medical decision making (see chart for details).  Patient seen and examined. Work-up initiated. She appears stable despite elevated HR. CXR pending. EKG reviewed, ? Atrial flutter vs svt vs ST.     Vital signs reviewed and are as follows: BP (!) 143/79 (BP Location:  Right Arm)   Pulse (!) 159   Temp 98.7 F (37.1 C) (Oral)   Resp (!) 24   SpO2 98%   Pt discussed with and seen by Dr. Denton Lank. Reviewed EKG. Plan trial of adenosine.   1:48 PM I went to room to attempt more proximal IV to administer adenosine, patient spontaneously converted from previous rhythm into sinus tachycardia.  Repeat EKG ordered.  ED ECG REPORT   Date: 12/10/2020  Rate: 122  Rhythm: sinus tachycardia  QRS Axis: normal  Intervals: normal  ST/T Wave abnormalities: normal  Conduction Disutrbances:none  Narrative Interpretation:   Old EKG Reviewed: changes noted  I have personally reviewed the EKG tracing and agree with the computerized printout as noted.  3:09 PM Updated on results thus far.   I would like to see HR improve -- still in 120's. Will give additional fluids, ambulate.   If improving, can likely d/c home.   Pt in agreement with plan.   BP 136/81   Pulse (!) 122   Temp 98.7 F (37.1 C) (Oral)   Resp 16   SpO2 97%     MDM Rules/Calculators/A&P                          COVID-19: Patient with cough and shortness of breath.  No hypoxia.  No chest pain.  If improved, can go home with symptomatic management and return instructions.   Tachycardia: Atrial flutter versus SVT, spontaneously converted to sinus tachycardia.  Will need cardiology follow-up.  Final Clinical Impression(s) / ED Diagnoses Final diagnoses:  COVID-19  Atrial flutter with rapid ventricular response [facility])    Rx / DC Orders ED Discharge Orders    None       Renne Crigler, PA-C 12/10/20 1542    Cathren Laine, MD 12/11/20 1313

## 2020-12-10 NOTE — ED Notes (Signed)
Pt ambulated in the room. Pt's O2 primarily between 92%-97%. Pt stated that if she moved, she had trouble breathing because of her excessive coughing. Pt's pulse ambulating was between 120s- upper 130s and pt coughed several times.

## 2020-12-10 NOTE — ED Triage Notes (Signed)
Pt present cough and SOB with chest tighness. Pt states symptoms started last Friday.  Pt is having difficulty breathing.

## 2020-12-10 NOTE — ED Provider Notes (Signed)
Care of patient assumed at 3:00 PM.  Briefly:  PMHx - DM, HTN, obesity  COVID-19. Has had cough/congestion x 1 week.  Arrived with tachycardia to the 150s-160s, question of atrial flutter vs SVT vs sinus tachycardia. Just prior to giving adenosine, pt spontaneously converted with improvement in rate and sinus tachycardia noted on monitor.  CXR clear. Pt has not been hypoxic. However remains tachycardic to 120s.  Plan at time of handoff:  Hydrate (1L NS ordered), ambulate, reassess. Hopeful that HR will improve.  Anticipate likely discharge with referral to cardiology.  Physical Exam  BP (!) 180/101 (BP Location: Right Wrist)   Pulse (!) 127   Temp 99.8 F (37.7 C) (Oral)   Resp (!) 26   SpO2 100%   Physical Exam Vitals and nursing note reviewed.  Constitutional:      General: She is not in acute distress.    Appearance: She is well-developed and well-nourished. She is obese. She is ill-appearing. She is not toxic-appearing.     Comments: Fatigued-appearing  HENT:     Head: Normocephalic and atraumatic.  Eyes:     General: No scleral icterus.    Extraocular Movements: Extraocular movements intact.  Cardiovascular:     Rate and Rhythm: Regular rhythm. Tachycardia present.  Pulmonary:     Effort: Pulmonary effort is normal. No respiratory distress.  Musculoskeletal:        General: No edema.     Cervical back: Neck supple.  Skin:    General: Skin is warm and dry.  Neurological:     Mental Status: She is alert.     Comments: Alert, grossly oriented, moves all extremities spontaneously, normal gait  Psychiatric:        Mood and Affect: Mood and affect normal.        Behavior: Behavior normal.     ED Course/Procedures     Procedures  MDM  Pt ill-appearing but stable on initial evaluation. She remains tachycardic to the 120s and up to 130s with ambulation. Continues to have normal SpO2. CTA chest ordered given pt's history of prior LE clot and persistent  tachycardia.  CTA chest without definitive evidence of PE, but noted to be poorly timed and limited study. Will give additional 1L IV fluids.  On reassessment pt feeling much better and endorses improvement in her breathing. Tachycardia has improved to low 100s. Ambulating comfortably without desaturations. Still HR > 100. Will give one last liter of fluid and reassess for improvement in HR. Plan is for discharge with outpatient cardiology follow up if pt can improve to <100 or very close.  At time of handoff to night team, pt remained in stable condition. 3rd liter fluids is running. Have communicated history, workup, and current plan to oncoming ED provider. Please see her note for documentation of the remainder of this encounter and final disposition.   Corliss Blacker, MD 12/11/20 Lazarus Gowda    Benjiman Core, MD 12/11/20 [phone]

## 2020-12-11 NOTE — ED Provider Notes (Signed)
Care assume from Dr. Lyndel Safe at shift change. See Haze Rushing note for full H&P.   Per his note, " Patient with history of diabetes and hypertension presents the emergency department today for evaluation of respiratory illness.  [facility] was seen at [facility] prior to arrival today.  Patient is on day 8 of illness.  She began having congestion type symptoms which then worsened to a cough.  Patient has had productive cough.  She has had sweats at night but no documented fevers.  She has chest pain with coughing only.  No nausea, vomiting, or diarrhea.  She was found to be tachycardic at urgent care and was transported to the hospital by EMS.  Heart rate 140-160.  Appears to be sinus tachycardia.  Patient denies history of thyroid disease.  She has received 2 doses of Pfizer Covid vaccine.  No known sick contacts."   Physical Exam  BP (!) 168/90   Pulse (!) 103   Temp 99.5 F (37.5 C) (Oral)   Resp (!) 24   SpO2 94%   Physical Exam Vitals and nursing note reviewed.  Constitutional:      General: She is not in acute distress.    Appearance: She is well-developed and well-nourished.  HENT:     Head: Normocephalic and atraumatic.  Eyes:     Conjunctiva/sclera: Conjunctivae normal.  Cardiovascular:     Rate and Rhythm: Tachycardia present.     Comments: 103 on monitor Pulmonary:     Effort: Pulmonary effort is normal.  Musculoskeletal:        General: Normal range of motion.     Cervical back: Neck supple.  Skin:    General: Skin is warm and dry.  Neurological:     Mental Status: She is alert.  Psychiatric:        Mood and Affect: Mood and affect normal.       ED Course/Procedures     Procedures  Results for orders placed or performed during the hospital encounter of 12/10/20  Resp Panel by RT-PCR (Flu A&B, Covid) Nasopharyngeal Swab   Specimen: Nasopharyngeal Swab; Nasopharyngeal(NP) swabs in vial transport medium  Result Value Ref Range   SARS Coronavirus 2  by RT PCR POSITIVE (A) NEGATIVE   Influenza A by PCR NEGATIVE NEGATIVE   Influenza B by PCR NEGATIVE NEGATIVE  CBC with Differential/Platelet  Result Value Ref Range   WBC 9.6 4.0 - 10.5 K/uL   RBC 4.47 3.87 - 5.11 MIL/uL   Hemoglobin 11.5 (L) 12.0 - 15.0 g/dL   HCT 36.0 36.0 - 46.0 %   MCV 80.5 80.0 - 100.0 fL   MCH 25.7 (L) 26.0 - 34.0 pg   MCHC 31.9 30.0 - 36.0 g/dL   RDW 16.0 (H) 11.5 - 15.5 %   Platelets 262 150 - 400 K/uL   nRBC 0.0 0.0 - 0.2 %   Neutrophils Relative % 88 %   Neutro Abs 8.4 (H) 1.7 - 7.7 K/uL   Lymphocytes Relative 9 %   Lymphs Abs 0.8 0.7 - 4.0 K/uL   Monocytes Relative 3 %   Monocytes Absolute 0.3 0.1 - 1.0 K/uL   Eosinophils Relative 0 %   Eosinophils Absolute 0.0 0.0 - 0.5 K/uL   Basophils Relative 0 %   Basophils Absolute 0.0 0.0 - 0.1 K/uL   Immature Granulocytes 0 %   Abs Immature Granulocytes 0.03 0.00 - 0.07 K/uL  Basic metabolic panel  Result Value Ref Range   Sodium 138  135 - 145 mmol/L   Potassium 3.8 3.5 - 5.1 mmol/L   Chloride 103 98 - 111 mmol/L   CO2 20 (L) 22 - 32 mmol/L   Glucose, Bld 241 (H) 70 - 99 mg/dL   BUN 11 6 - 20 mg/dL   Creatinine, Ser 7.25 0.44 - 1.00 mg/dL   Calcium 8.8 (L) 8.9 - 10.3 mg/dL   GFR, Estimated >36 >64 mL/min   Anion gap 15 5 - 15  Brain natriuretic peptide  Result Value Ref Range   B Natriuretic Peptide 39.6 0.0 - 100.0 pg/mL  TSH  Result Value Ref Range   TSH 0.522 0.350 - 4.500 uIU/mL   CT Angio Chest PE W and/or Wo Contrast  Result Date: 12/10/2020 CLINICAL DATA:  Pulmonary embolus suspected with high probability. Tachycardia. Shortness of breath. Symptoms starting on 12/03/2020. COVID positive on 12/31. EXAM: CT ANGIOGRAPHY CHEST WITH CONTRAST TECHNIQUE: Multidetector CT imaging of the chest was performed using the standard protocol during bolus administration of intravenous contrast. Multiplanar CT image reconstructions and MIPs were obtained to evaluate the vascular anatomy. CONTRAST:  79mL  OMNIPAQUE IOHEXOL 350 MG/ML SOLN COMPARISON:  Chest radiograph 12/10/2020 FINDINGS: Cardiovascular: Poor contrast bolus limits the evaluation. No large central pulmonary emboli are demonstrated but segmental or subsegmental emboli could be obscured. Cardiac enlargement. No pericardial effusions. Normal caliber thoracic aorta. Great vessel origins are patent. Mediastinum/Nodes: No significant lymphadenopathy. Esophagus is decompressed. Lungs/Pleura: Patchy peripheral, perihilar, and basilar airspace infiltrates throughout the lungs. Appearance is compatible with COVID pneumonia. No pleural effusions. No pneumothorax. Airways are patent. Upper Abdomen: No acute abnormalities are demonstrated in the visualized upper abdomen. Musculoskeletal: Degenerative changes in the spine. No destructive bone lesions. Review of the MIP images confirms the above findings. IMPRESSION: 1. Poor contrast bolus limits the evaluation. No large central pulmonary emboli are demonstrated but segmental or subsegmental emboli could be obscured. 2. Patchy peripheral, perihilar, and basilar airspace infiltrates throughout the lungs. Appearance is compatible with COVID pneumonia. 3. Cardiac enlargement. Electronically Signed   By: Burman Nieves M.D.   On: 12/10/2020 19:13   DG Chest Port 1 View  Result Date: 12/10/2020 CLINICAL DATA:  Cough, SOB, tachy, had pt remove earphones she did not remove necklace EXAM: PORTABLE CHEST 1 VIEW COMPARISON:  11/21/7 FINDINGS: Cardiac silhouette normal in size.  No mediastinal or hilar masses. Clear lungs.  No convincing pleural effusion and no pneumothorax. Skeletal structures are grossly intact. IMPRESSION: No active disease. Electronically Signed   By: Amie Portland M.D.   On: 12/10/2020 12:31     MDM   Briefly, patient with Covid.  There was concern because she was initially very tachycardic in the 160s with concern for possible a flutter, versus SVT.  She self converted and heart rate improved  but has been persistently in the 130s.  Prior team attempted to admit patient for persistent tachycardia however hospitalist declined and recommended IV fluids and reassessment.  At shift change pending reassessment after IV fluids.  Following IV fluids, reassess patient and heart rate is below 110 and she states she is feeling much improved.  Suspect that she may have had some dehydration and tachycardia from elevated temp. Discussed plan for discharge with symptomatic treatment with the patient and she is comfortable with this plan.  Advise close follow-up and strict return precautions.  She voices understanding of the plan and reasons to return.  Questions answered.  Patient stable for discharge.      Nathalia Wismer S, PA-C  12/11/20 0201    Gilda Crease, MD 12/11/20 [phone]

## 2020-12-11 NOTE — ED Notes (Signed)
Patient verbalizes understanding of discharge instructions. Opportunity for questioning and answers were provided. Armband removed by staff, pt discharged from ED ambulatory to home.  

## 2020-12-14 LAB — NOVEL CORONAVIRUS, NAA: SARS-CoV-2, NAA: DETECTED — AB

## 2020-12-17 ENCOUNTER — Encounter (HOSPITAL_COMMUNITY): Payer: Self-pay | Admitting: Physician Assistant

## 2020-12-17 ENCOUNTER — Other Ambulatory Visit: Payer: Self-pay

## 2020-12-17 ENCOUNTER — Ambulatory Visit (HOSPITAL_COMMUNITY)
Admission: RE | Admit: 2020-12-17 | Discharge: 2020-12-17 | Disposition: A | Discharge status: Home / Self Care | Payer: 59 | Source: Ambulatory Visit | Attending: Physician Assistant | Admitting: Physician Assistant

## 2020-12-17 VITALS — Ht 65.0 in | Wt 294.0 lb

## 2020-12-17 DIAGNOSIS — I471 Supraventricular tachycardia: Secondary | ICD-10-CM | POA: Diagnosis not present

## 2020-12-17 NOTE — Progress Notes (Signed)
Electrophysiology TeleHealth Note  Audio/video telehealth visit is felt to be most appropriate for this patient at this time.  See consent below from today for patient consent regarding telehealth for the [facility].    Date:  12/17/2020   ID:  Destiny Alexander, DOB 1961/06/13, MRN [id number]  Location: home Provider location: [address] [place], [place] [address] Evaluation Performed: New patient consult  PCP:  Sigmund Hazel, MD  Primary Cardiologist: None Primary Electrophysiologist: none    History of Present Illness: Destiny Alexander is a 60 y.o. female with a history of DM, HLD, HTN, obesity who presents via audio/video conferencing for a telehealth visit today. The patient is referred for new consultation regarding possible atrial flutter by Dr Aileen Pilot. Patient presented to ED on 12/10/20 with cough, congestion for one week and was found to be COVID positive. She was also tachycardic with heart rates 150s-160s. Just prior to giving adenosine, she spontaneously slowed to sinus tach with HR 120s. ECG on arrival showed likely SVT but could not rule out atrial flutter. She does report that she has a fast heart rate (90s-100s) at baseline. She is feeling much better now with only a mild cough. No heart racing or palpitations.   Today, she denies symptoms of palpitations, chest pain, shortness of breath, orthopnea, PND, lower extremity edema, claudication, dizziness, presyncope, syncope, bleeding, or neurologic sequela. The patient is tolerating medications without difficulties and is otherwise without complaint today.     Atrial Fibrillation Risk Factors:  she does not have symptoms or diagnosis of sleep apnea. she does not have a history of rheumatic fever.   she has a BMI of Body mass index is 48.92 kg/m.Marland Kitchen Filed Weights   12/17/20 0824  Weight: 133.4 kg    Past Medical History:  Diagnosis Date  . Diabetes mellitus   . Hypertension    Past Surgical History:   Procedure Laterality Date  . APPENDECTOMY    . CESAREAN SECTION     x 2  . CHOLECYSTECTOMY    . LIVER SURGERY     "piece of liver removed w/choley"  . TUBAL LIGATION       Current Outpatient Medications  Medication Sig Dispense Refill  . benzonatate (TESSALON) 100 MG capsule Take 1 capsule (100 mg total) by mouth every 8 (eight) hours. 15 capsule 0  . Cholecalciferol (VITAMIN D) 50 MCG (2000 UT) CAPS Take 4,000 Units by mouth daily.    . Continuous Blood Gluc Sensor (FREESTYLE LIBRE 2 SENSOR) MISC     . cyclobenzaprine (FLEXERIL) 10 MG tablet Take 10 mg by mouth daily as needed for muscle spasms.  0  . hydrochlorothiazide (MICROZIDE) 12.5 MG capsule Take 12.5 mg by mouth daily.    Marland Kitchen ibuprofen (ADVIL,MOTRIN) 600 MG tablet Take 1 tablet (600 mg total) by mouth every 6 (six) hours as needed. 30 tablet 0  . Insulin Aspart FlexPen 100 UNIT/ML SOPN Inject 30 Units into the skin 2 (two) times daily.    . Insulin Glargine (TOUJEO SOLOSTAR [place]) Inject 45 Units into the skin in the morning.    Marland Kitchen levocetirizine (XYZAL) 5 MG tablet Take 5 mg by mouth every evening.    Marland Kitchen losartan (COZAAR) 100 MG tablet Take 100 mg by mouth daily.    . megestrol (MEGACE) 40 MG tablet Take 40 mg by mouth daily.    . meloxicam (MOBIC) 7.5 MG tablet Take 1 tablet (7.5 mg total) by mouth daily. 20 tablet 0  .  Multiple Vitamin (MULTI-VITAMIN) tablet Take 1 tablet by mouth daily.    Marland Kitchen OZEMPIC, 1 MG/DOSE, 4 MG/3ML SOPN Inject 1 mg into the skin every Saturday.    . pravastatin (PRAVACHOL) 10 MG tablet Take 10 mg by mouth at bedtime.     No current facility-administered medications for this encounter.    Allergies:   Pioglitazone, Ace inhibitors, Canagliflozin, Amoxicillin, Metformin and related, Nabumetone, and Penicillins   Social History:  The patient  reports that she has quit smoking. She started smoking about 4 years ago. She has never used smokeless tobacco. She reports that she does not drink alcohol and does  not use drugs.   Family History:  The patient's  family history includes Arthritis/Rheumatoid in her mother; CVA in her mother; Diabetes in her brother, father, and mother; Heart failure in her father; Hypertension in her father, mother, and sister.    ROS:  Please see the history of present illness.   All other systems are personally reviewed and negative.   Exam: Well appearing, alert and conversant, regular work of breathing,  good skin color  Patient does not have BP machine to evaluate HR or BP.  Recent Labs: 12/10/2020: B Natriuretic Peptide 39.6; BUN 11; Creatinine, Ser 0.95; Hemoglobin 11.5; Platelets 262; Potassium 3.8; Sodium 138; TSH 0.522  Epic notes personally reviewed    ASSESSMENT AND PLAN:  1. 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 Suspect 2/2 acute illness with COVID. Will not start anticoagulation at this point. Will check echocardiogram.  This patients CHA2DS2-VASc Score and unadjusted Ischemic Stroke Rate (% per year) is equal to 3.2 % stroke rate/year from a score of 3  Above score calculated as 1 point each if present [CHF, HTN, DM, Vascular=MI/PAD/Aortic Plaque, Age if 65-74, or Female] Above score calculated as 2 points each if present [Age > 75, or Stroke/TIA/TE]  2. HTN Continue present therapy.  3. COVID-19 Infection Plans per primary.   Follow-up pending results of echocardiogram.  Current medicines are reviewed at length with the patient today.   The patient does not have concerns regarding her medicines.  The following changes were made today:  none  Labs/ tests ordered today include:  No orders of the defined types were placed in this encounter.   Patient Risk:  after full review of this patients clinical status, I feel that they are at moderate risk at this time.   Today, I have spent 9 minutes with the patient with telehealth technology discussing the above.    Gwenlyn Perking  PA-C 12/17/2020 8:54 AM  [facility] [place], [place] [address] [phone]   I hereby voluntarily request, consent and authorize the [facility] and its employed or contracted physicians, physician assistants, nurse practitioners or other licensed health care professionals (the Practitioner), to provide me with telemedicine health care services (the "Services") as deemed necessary by the treating Practitioner. I acknowledge and consent to receive the Services by the Practitioner via telemedicine. I understand that the telemedicine visit will involve communicating with the Practitioner through live audiovisual communication technology and the disclosure of certain medical information by electronic transmission. I acknowledge that I have been given the opportunity to request an in-person assessment or other available alternative prior to the telemedicine visit and am voluntarily participating in the telemedicine visit.   I understand that I have the right to withhold or withdraw my consent to the use of  telemedicine in the course of my care at any time, without affecting my right to future care or treatment, and that the Practitioner or I may terminate the telemedicine visit at any time. I understand that I have the right to inspect all information obtained and/or recorded in the course of the telemedicine visit and may receive copies of available information for a reasonable fee.  I understand that some of the potential risks of receiving the Services via telemedicine include:   Delay or interruption in medical evaluation due to technological equipment failure or disruption;  Information transmitted may not be sufficient (e.g. poor resolution of images) to allow for appropriate medical decision making by the Practitioner; and/or  In rare instances, security protocols could fail, causing a breach of personal health information.   Furthermore, I  acknowledge that it is my responsibility to provide information about my medical history, conditions and care that is complete and accurate to the best of my ability. I acknowledge that Practitioner's advice, recommendations, and/or decision may be based on factors not within their control, such as incomplete or inaccurate data provided by me or distortions of diagnostic images or specimens that may result from electronic transmissions. I understand that the practice of medicine is not an exact science and that Practitioner makes no warranties or guarantees regarding treatment outcomes. I acknowledge that I will receive a copy of this consent concurrently upon execution via email to the email address I last provided but may also request a printed copy by calling the office of the [facility].  I understand that my insurance will be billed for this visit.   I have read or had this consent read to me.  I understand the contents of this consent, which adequately explains the benefits and risks of the Services being provided via telemedicine.  I have been provided ample opportunity to ask questions regarding this consent and the Services and have had my questions answered to my satisfaction.  I give my informed consent for the services to be provided through the use of telemedicine in my medical care  By participating in this telemedicine visit I agree to the above.

## 2020-12-23 ENCOUNTER — Ambulatory Visit (HOSPITAL_COMMUNITY)
Admission: RE | Admit: 2020-12-23 | Discharge: 2020-12-23 | Disposition: A | Discharge status: Home / Self Care | Payer: 59 | Source: Ambulatory Visit | Attending: Cardiovascular Disease | Admitting: Cardiovascular Disease

## 2020-12-23 ENCOUNTER — Other Ambulatory Visit: Payer: Self-pay

## 2020-12-23 DIAGNOSIS — I471 Supraventricular tachycardia: Secondary | ICD-10-CM | POA: Diagnosis not present

## 2020-12-23 DIAGNOSIS — I080 Rheumatic disorders of both mitral and aortic valves: Secondary | ICD-10-CM | POA: Insufficient documentation

## 2020-12-23 DIAGNOSIS — I480 Paroxysmal atrial fibrillation: Secondary | ICD-10-CM | POA: Diagnosis present

## 2020-12-23 LAB — ECHOCARDIOGRAM COMPLETE
Area-P 1/2: 3.31 cm2
S' Lateral: 3.7 cm

## 2020-12-23 NOTE — Progress Notes (Signed)
  Echocardiogram 2D Echocardiogram has been performed.  Destiny Alexander 12/23/2020, 3:55 PM

## 2021-12-14 ENCOUNTER — Other Ambulatory Visit: Payer: Self-pay

## 2021-12-14 ENCOUNTER — Ambulatory Visit
Admission: EM | Admit: 2021-12-14 | Discharge: 2021-12-14 | Disposition: A | Discharge status: Home / Self Care | Payer: BC Managed Care – PPO | Attending: Internal Medicine | Admitting: Internal Medicine

## 2021-12-14 DIAGNOSIS — J069 Acute upper respiratory infection, unspecified: Secondary | ICD-10-CM

## 2021-12-14 MED ORDER — FLUTICASONE PROPIONATE 50 MCG/ACT NA SUSP: 1.0000 | Freq: Every day | NASAL | 0 refills | Status: AC

## 2021-12-14 MED ORDER — BENZONATATE 100 MG PO CAPS: 100.0000 mg | ORAL_CAPSULE | Freq: Three times a day (TID) | ORAL | 0 refills | Status: DC | PRN

## 2021-12-14 NOTE — Discharge Instructions (Signed)
It appears that you have a viral upper respiratory infection that should resolve in the next few days with symptomatic treatment.  This is treated with over-the-counter medications and not antibiotics.  You have been prescribed 2 medications to help alleviate your symptoms.  COVID-19 and flu test is pending.  We will call if it is positive.

## 2021-12-14 NOTE — ED Provider Notes (Signed)
[facility]    CSN: [id number] Arrival date & time: 12/14/21  0804      History   Chief Complaint Chief Complaint  Patient presents with   Nasal Congestion    HPI Destiny Alexander is a 61 y.o. female.   Patient presents with nasal congestion with drainage, nonproductive cough, headache, body aches that started yesterday.  Denies any known fever or sick contacts.  Denies chest pain, shortness of breath, nausea, vomiting, diarrhea, abdominal pain, sore throat, ear pain.  Patient has taken "allergy medicine" for symptoms with minimal improvement.    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      Home Medications    Prior to Admission medications   Medication Sig Start Date End Date Taking? Authorizing Provider  benzonatate (TESSALON) 100 MG capsule Take 1 capsule (100 mg total) by mouth every 8 (eight) hours as needed for cough. 12/14/21  Yes Camren Henthorn, Michele Rockers, FNP  fluticasone (FLONASE) 50 MCG/ACT nasal spray Place 1 spray into both nostrils daily for 3 days. 12/14/21 12/17/21 Yes Aidan Moten, Michele Rockers, FNP  Cholecalciferol (VITAMIN D) 50 MCG (2000 UT) CAPS Take 4,000 Units by mouth daily.    [provider]  Continuous Blood Gluc Sensor (FREESTYLE LIBRE 2 SENSOR) MISC  12/14/20   [provider]   cyclobenzaprine (FLEXERIL) 10 MG tablet Take 10 mg by mouth daily as needed for muscle spasms. 12/29/16   [provider]  hydrochlorothiazide (MICROZIDE) 12.5 MG capsule Take 12.5 mg by mouth daily. 11/15/20   [provider]  ibuprofen (ADVIL,MOTRIN) 600 MG tablet Take 1 tablet (600 mg total) by mouth every 6 (six) hours as needed. 10/31/16   Recardo Evangelist, PA-C  Insulin Aspart FlexPen 100 UNIT/ML SOPN Inject 30 Units into the skin 2 (two) times daily. 11/17/20   [provider]  Insulin Glargine (TOUJEO SOLOSTAR [place]) Inject 45 Units into the skin in the morning.    [provider]  levocetirizine (XYZAL) 5 MG tablet Take 5 mg by mouth every evening.    [provider]  losartan (COZAAR) 100 MG tablet Take 100 mg by mouth daily.    [provider]  megestrol (MEGACE) 40 MG tablet Take 40 mg by mouth daily. 11/18/20   [provider]  meloxicam (MOBIC) 7.5 MG tablet Take 1 tablet (7.5 mg total) by mouth daily. 06/18/20   Zigmund Gottron, NP  Multiple Vitamin (MULTI-VITAMIN) tablet Take 1 tablet by mouth daily.    [provider]  OZEMPIC, 1 MG/DOSE, 4 MG/3ML SOPN Inject 1 mg into the skin every Saturday. 11/15/20   [provider]  pravastatin (PRAVACHOL) 10 MG tablet Take 10 mg by mouth at bedtime.  [provider]    Family History Family History  Problem Relation Age of Onset   CVA Mother    Diabetes Mother    Hypertension Mother    Arthritis/Rheumatoid Mother    Heart failure Father    Diabetes Father    Hypertension Father    Diabetes Brother    Hypertension Sister     Social History Social History   Tobacco Use   Smoking status: Former    Types: Cigarettes    Start date: 11/24/2016   Smokeless tobacco: Never   Tobacco comments:    quit 1985  Substance Use Topics   Alcohol use: No    Comment: quit 1985   Drug use: No     Allergies   Pioglitazone, Ace inhibitors,  Canagliflozin, Amoxicillin, Metformin and related, Nabumetone, and Penicillins   Review of Systems Review of Systems Per HPI  Physical Exam Triage Vital Signs ED Triage Vitals  Enc Vitals Group     BP 12/14/21 0817 (!) 158/78     Pulse Rate 12/14/21 0816 (!) 112     Resp 12/14/21 0816 18     Temp 12/14/21 0816 98.3 F (36.8 C)     Temp Source 12/14/21 0816 Oral     SpO2 12/14/21 0816 98 %     Weight --      Height --      Head Circumference --      Peak Flow --      Pain Score 12/14/21 0817 0     Pain Loc --      Pain Edu? --      Excl. in GC? --    No data found.  Updated Vital Signs BP (!) 158/78 (BP Location: Right Arm)    Pulse (!) 112    Temp 98.3 F (36.8 C) (Oral)    Resp 18    SpO2 98%   Visual Acuity Right Eye Distance:   Left Eye Distance:   Bilateral Distance:    Right Eye Near:   Left Eye Near:    Bilateral Near:     Physical Exam Constitutional:      General: She is not in acute distress.    Appearance: Normal appearance. She is not toxic-appearing or diaphoretic.  HENT:     Head: Normocephalic and atraumatic.     Right Ear: Tympanic membrane and ear canal normal.     Left Ear: Tympanic membrane and ear canal normal.     Nose: Congestion present.     Mouth/Throat:     Mouth: Mucous membranes are moist.     Pharynx: No posterior oropharyngeal erythema.  Eyes:     Extraocular Movements: Extraocular movements intact.     Conjunctiva/sclera: Conjunctivae normal.     Pupils: Pupils are equal, round, and reactive to light.  Cardiovascular:     Rate and Rhythm: Normal rate and regular rhythm.     Pulses: Normal pulses.     Heart sounds: Normal heart sounds.  Pulmonary:     Effort: Pulmonary effort is normal. No respiratory distress.     Breath sounds: Normal breath sounds. No stridor. No wheezing, rhonchi or rales.  Abdominal:     General: Abdomen is flat. Bowel sounds are normal.     Palpations: Abdomen is soft.  Musculoskeletal:         General: Normal range of motion.     Cervical back: Normal range of motion.  Skin:    General: Skin is warm  and dry.  Neurological:     General: No focal deficit present.     Mental Status: She is alert and oriented to person, place, and time. Mental status is at baseline.  Psychiatric:        Mood and Affect: Mood normal.        Behavior: Behavior normal.     UC Treatments / Results  Labs (all labs ordered are listed, but only abnormal results are displayed) Labs Reviewed  COVID-19, FLU A+B NAA    EKG   Radiology No results found.  Procedures Procedures (including critical care time)  Medications Ordered in UC Medications - No data to display  Initial Impression / Assessment and Plan / UC Course  I have reviewed the triage vital signs and the nursing notes.  Pertinent labs & imaging results that were available during my care of the patient were reviewed by me and considered in my medical decision making (see chart for details).     Patient presents with symptoms likely from a viral upper respiratory infection. Differential includes bacterial pneumonia, sinusitis, allergic rhinitis, COVID-19, flu. Do not suspect underlying cardiopulmonary process. Symptoms seem unlikely related to ACS, CHF or COPD exacerbations, pneumonia, pneumothorax. Patient is nontoxic appearing and not in need of emergent medical intervention.  COVID-19 and flu test pending.  Recommended symptom control with over the counter medications.  Patient sent prescriptions.  Return if symptoms fail to improve in 1-2 weeks or you develop shortness of breath, chest pain, severe headache. Patient states understanding and is agreeable.  Discharged with PCP followup.  Final Clinical Impressions(s) / UC Diagnoses   Final diagnoses:  Viral upper respiratory tract infection with cough     Discharge Instructions      It appears that you have a viral upper respiratory infection that should resolve in the  next few days with symptomatic treatment.  This is treated with over-the-counter medications and not antibiotics.  You have been prescribed 2 medications to help alleviate your symptoms.  COVID-19 and flu test is pending.  We will call if it is positive.    ED Prescriptions     Medication Sig Dispense Auth. Provider   fluticasone (FLONASE) 50 MCG/ACT nasal spray Place 1 spray into both nostrils daily for 3 days. 16 g Lateasha Breuer, Hildred Alamin E, [place]   benzonatate (TESSALON) 100 MG capsule Take 1 capsule (100 mg total) by mouth every 8 (eight) hours as needed for cough. 21 capsule [place], Michele Rockers, [place]      PDMP not reviewed this encounter.   Teodora Medici, [place] 12/14/21 [phone]

## 2021-12-14 NOTE — ED Triage Notes (Signed)
Pt c/o nasal congestion with drainage, cough, headache, body aches,   Denies earache, chills, nausea, vomiting, diarrhea, constipation.   Onset "around the holiday."

## 2021-12-15 LAB — COVID-19, FLU A+B NAA
Influenza A, NAA: NOT DETECTED
Influenza B, NAA: NOT DETECTED
SARS-CoV-2, NAA: DETECTED — AB

## 2022-01-24 DIAGNOSIS — F330 Major depressive disorder, recurrent, mild: Secondary | ICD-10-CM | POA: Diagnosis not present

## 2022-02-24 DIAGNOSIS — F330 Major depressive disorder, recurrent, mild: Secondary | ICD-10-CM | POA: Diagnosis not present

## 2022-03-10 DIAGNOSIS — F330 Major depressive disorder, recurrent, mild: Secondary | ICD-10-CM | POA: Diagnosis not present

## 2022-03-22 DIAGNOSIS — E785 Hyperlipidemia, unspecified: Secondary | ICD-10-CM | POA: Diagnosis not present

## 2022-03-22 DIAGNOSIS — I10 Essential (primary) hypertension: Secondary | ICD-10-CM | POA: Diagnosis not present

## 2022-03-22 DIAGNOSIS — E1169 Type 2 diabetes mellitus with other specified complication: Secondary | ICD-10-CM | POA: Diagnosis not present

## 2022-03-24 DIAGNOSIS — F330 Major depressive disorder, recurrent, mild: Secondary | ICD-10-CM | POA: Diagnosis not present

## 2022-03-27 DIAGNOSIS — E1169 Type 2 diabetes mellitus with other specified complication: Secondary | ICD-10-CM | POA: Diagnosis not present

## 2022-03-27 DIAGNOSIS — Z794 Long term (current) use of insulin: Secondary | ICD-10-CM | POA: Diagnosis not present

## 2022-03-27 DIAGNOSIS — E1165 Type 2 diabetes mellitus with hyperglycemia: Secondary | ICD-10-CM | POA: Diagnosis not present

## 2022-03-27 DIAGNOSIS — E785 Hyperlipidemia, unspecified: Secondary | ICD-10-CM | POA: Diagnosis not present

## 2022-04-21 DIAGNOSIS — F330 Major depressive disorder, recurrent, mild: Secondary | ICD-10-CM | POA: Diagnosis not present

## 2022-05-03 DIAGNOSIS — E1169 Type 2 diabetes mellitus with other specified complication: Secondary | ICD-10-CM | POA: Diagnosis not present

## 2022-05-03 DIAGNOSIS — E1165 Type 2 diabetes mellitus with hyperglycemia: Secondary | ICD-10-CM | POA: Diagnosis not present

## 2022-05-03 DIAGNOSIS — Z794 Long term (current) use of insulin: Secondary | ICD-10-CM | POA: Diagnosis not present

## 2022-05-03 DIAGNOSIS — E785 Hyperlipidemia, unspecified: Secondary | ICD-10-CM | POA: Diagnosis not present

## 2022-05-05 DIAGNOSIS — F330 Major depressive disorder, recurrent, mild: Secondary | ICD-10-CM | POA: Diagnosis not present

## 2022-05-19 DIAGNOSIS — F330 Major depressive disorder, recurrent, mild: Secondary | ICD-10-CM | POA: Diagnosis not present

## 2022-06-06 DIAGNOSIS — N939 Abnormal uterine and vaginal bleeding, unspecified: Secondary | ICD-10-CM | POA: Diagnosis not present

## 2022-06-06 DIAGNOSIS — Z01419 Encounter for gynecological examination (general) (routine) without abnormal findings: Secondary | ICD-10-CM | POA: Diagnosis not present

## 2022-06-16 DIAGNOSIS — F330 Major depressive disorder, recurrent, mild: Secondary | ICD-10-CM | POA: Diagnosis not present

## 2022-06-16 DIAGNOSIS — R0789 Other chest pain: Secondary | ICD-10-CM | POA: Diagnosis not present

## 2022-07-10 ENCOUNTER — Other Ambulatory Visit: Payer: Self-pay

## 2022-07-10 DIAGNOSIS — N939 Abnormal uterine and vaginal bleeding, unspecified: Secondary | ICD-10-CM | POA: Diagnosis not present

## 2022-07-10 DIAGNOSIS — Z3202 Encounter for pregnancy test, result negative: Secondary | ICD-10-CM | POA: Diagnosis not present

## 2022-07-28 DIAGNOSIS — F330 Major depressive disorder, recurrent, mild: Secondary | ICD-10-CM | POA: Diagnosis not present

## 2022-08-01 DIAGNOSIS — E1165 Type 2 diabetes mellitus with hyperglycemia: Secondary | ICD-10-CM | POA: Diagnosis not present

## 2022-08-01 DIAGNOSIS — E785 Hyperlipidemia, unspecified: Secondary | ICD-10-CM | POA: Diagnosis not present

## 2022-08-01 DIAGNOSIS — E1169 Type 2 diabetes mellitus with other specified complication: Secondary | ICD-10-CM | POA: Diagnosis not present

## 2022-08-01 DIAGNOSIS — I10 Essential (primary) hypertension: Secondary | ICD-10-CM | POA: Diagnosis not present

## 2022-08-03 ENCOUNTER — Other Ambulatory Visit: Payer: Self-pay | Admitting: Gastroenterology

## 2022-08-03 DIAGNOSIS — Z01818 Encounter for other preprocedural examination: Secondary | ICD-10-CM | POA: Diagnosis not present

## 2022-08-24 DIAGNOSIS — N939 Abnormal uterine and vaginal bleeding, unspecified: Secondary | ICD-10-CM | POA: Diagnosis not present

## 2022-08-24 DIAGNOSIS — E1165 Type 2 diabetes mellitus with hyperglycemia: Secondary | ICD-10-CM | POA: Diagnosis not present

## 2022-08-24 DIAGNOSIS — D259 Leiomyoma of uterus, unspecified: Secondary | ICD-10-CM | POA: Diagnosis not present

## 2022-08-29 DIAGNOSIS — N839 Noninflammatory disorder of ovary, fallopian tube and broad ligament, unspecified: Secondary | ICD-10-CM | POA: Diagnosis not present

## 2022-09-29 DIAGNOSIS — F330 Major depressive disorder, recurrent, mild: Secondary | ICD-10-CM | POA: Diagnosis not present

## 2022-10-13 DIAGNOSIS — E785 Hyperlipidemia, unspecified: Secondary | ICD-10-CM | POA: Diagnosis not present

## 2022-10-13 DIAGNOSIS — E1165 Type 2 diabetes mellitus with hyperglycemia: Secondary | ICD-10-CM | POA: Diagnosis not present

## 2022-10-17 ENCOUNTER — Encounter (HOSPITAL_COMMUNITY): Payer: Self-pay | Admitting: Gastroenterology

## 2022-10-17 NOTE — Progress Notes (Signed)
Attempted to obtain medical history via telephone, unable to reach at this time. HIPAA compliant voicemail message left requesting return call to pre surgical testing department. 

## 2022-10-19 DIAGNOSIS — E785 Hyperlipidemia, unspecified: Secondary | ICD-10-CM | POA: Diagnosis not present

## 2022-10-19 DIAGNOSIS — I10 Essential (primary) hypertension: Secondary | ICD-10-CM | POA: Diagnosis not present

## 2022-10-19 DIAGNOSIS — E1165 Type 2 diabetes mellitus with hyperglycemia: Secondary | ICD-10-CM | POA: Diagnosis not present

## 2022-10-23 ENCOUNTER — Encounter (HOSPITAL_COMMUNITY): Payer: Self-pay | Admitting: Gastroenterology

## 2022-10-25 ENCOUNTER — Ambulatory Visit (HOSPITAL_COMMUNITY): Payer: BC Managed Care – PPO | Admitting: Certified Registered"

## 2022-10-25 ENCOUNTER — Other Ambulatory Visit: Payer: Self-pay

## 2022-10-25 ENCOUNTER — Ambulatory Visit (HOSPITAL_COMMUNITY)
Admission: RE | Admit: 2022-10-25 | Discharge: 2022-10-25 | Disposition: A | Discharge status: Home / Self Care | Payer: BC Managed Care – PPO | Attending: Gastroenterology | Admitting: Gastroenterology

## 2022-10-25 ENCOUNTER — Encounter (HOSPITAL_COMMUNITY)
Admission: RE | Disposition: A | Discharge status: Home / Self Care | Payer: Self-pay | Source: Home / Self Care | Attending: Gastroenterology

## 2022-10-25 DIAGNOSIS — E119 Type 2 diabetes mellitus without complications: Secondary | ICD-10-CM | POA: Diagnosis not present

## 2022-10-25 DIAGNOSIS — Z1211 Encounter for screening for malignant neoplasm of colon: Secondary | ICD-10-CM | POA: Insufficient documentation

## 2022-10-25 DIAGNOSIS — Z794 Long term (current) use of insulin: Secondary | ICD-10-CM | POA: Insufficient documentation

## 2022-10-25 DIAGNOSIS — Z87891 Personal history of nicotine dependence: Secondary | ICD-10-CM | POA: Insufficient documentation

## 2022-10-25 DIAGNOSIS — I10 Essential (primary) hypertension: Secondary | ICD-10-CM | POA: Diagnosis not present

## 2022-10-25 DIAGNOSIS — Z79899 Other long term (current) drug therapy: Secondary | ICD-10-CM | POA: Diagnosis not present

## 2022-10-25 HISTORY — PX: COLONOSCOPY WITH PROPOFOL: SHX5780

## 2022-10-25 LAB — GLUCOSE, CAPILLARY: Glucose-Capillary: 178 mg/dL — ABNORMAL HIGH (ref 70–99)

## 2022-10-25 SURGERY — COLONOSCOPY WITH PROPOFOL
Anesthesia: Monitor Anesthesia Care

## 2022-10-25 MED ORDER — PROPOFOL 500 MG/50ML IV EMUL
INTRAVENOUS | Status: DC | PRN
  Administered 2022-10-25: 145 ug/kg/min via INTRAVENOUS
  Administered 2022-10-25: 135 ug/kg/min via INTRAVENOUS

## 2022-10-25 MED ORDER — PROPOFOL 10 MG/ML IV BOLUS
INTRAVENOUS | Status: DC | PRN
  Administered 2022-10-25 (×3): 20 mg via INTRAVENOUS

## 2022-10-25 MED ORDER — LACTATED RINGERS IV SOLN: INTRAVENOUS | Status: DC

## 2022-10-25 MED ORDER — SODIUM CHLORIDE 0.9 % IV SOLN: INTRAVENOUS | Status: DC

## 2022-10-25 MED ORDER — PROPOFOL 1000 MG/100ML IV EMUL
INTRAVENOUS | Status: AC
  Filled 2022-10-25: qty 100

## 2022-10-25 SURGICAL SUPPLY — 22 items

## 2022-10-25 NOTE — Transfer of Care (Signed)
Immediate Anesthesia Transfer of Care Note  Patient: Destiny Alexander  Procedure(s) Performed: COLONOSCOPY WITH PROPOFOL  Patient Location: PACU  Anesthesia Type:MAC  Level of Consciousness: awake, alert , and oriented  Airway & Oxygen Therapy: Patient Spontanous Breathing and Patient connected to face mask oxygen  Post-op Assessment: Report given to RN, Post -op Vital signs reviewed and stable, and Patient moving all extremities X 4  Post vital signs: Reviewed and stable  Last Vitals:  Vitals Value Taken Time  BP 113/71   Temp    Pulse 105   Resp 12   SpO2 100     Last Pain:  Vitals:   10/25/22 0954  TempSrc: Temporal  PainSc: 0-No pain         Complications: No notable events documented.

## 2022-10-25 NOTE — H&P (Signed)
[facility] H/P Note  Chief Complaint: colon cancer screening  HPI: Destiny Alexander is an 61 y.o. female.  Here for colon cancer screening.  Colonoscopy 2013 [facility]) reported normal, due for repeat.  No abdominal pain, change in bowel habits, blood in stool, unintentional weight loss.  Past Medical History:  Diagnosis Date   Diabetes mellitus    Hypertension     Past Surgical History:  Procedure Laterality Date   APPENDECTOMY     CESAREAN SECTION     x 2   CHOLECYSTECTOMY     LIVER SURGERY     "piece of liver removed w/choley"   TUBAL LIGATION      Medications Prior to Admission  Medication Sig Dispense Refill   acetaminophen (TYLENOL) 650 MG CR tablet Take 650-1,300 mg by mouth 2 (two) times daily as needed for pain.     amLODipine (NORVASC) 5 MG tablet Take 5 mg by mouth every other day. Alternate days with losartan     cyclobenzaprine (FLEXERIL) 10 MG tablet Take 10 mg by mouth 2 (two) times daily as needed for muscle spasms.  0   empagliflozin (JARDIANCE) 25 MG TABS tablet Take 12.5 mg by mouth daily.     fluticasone (FLONASE) 50 MCG/ACT nasal spray Place 1 spray into both nostrils daily for 3 days. (Patient taking differently: Place 1 spray into both nostrils daily as needed for allergies.) 16 g 0   hydrochlorothiazide (MICROZIDE) 12.5 MG capsule Take 12.5 mg by mouth daily.     insulin aspart (NOVOLOG FLEXPEN) 100 UNIT/ML FlexPen Inject 35-40 Units into the skin See admin instructions. inject 35 units with breakfast and 40 units with lunch and dinner as directed     insulin glargine, 1 Unit Dial, (TOUJEO SOLOSTAR) 300 UNIT/ML Solostar Pen Inject 40 Units into the skin daily.     levocetirizine (XYZAL) 5 MG tablet Take 5 mg by mouth daily as needed for allergies.     losartan (COZAAR) 100 MG tablet Take 100 mg by mouth every other day. Alternate days with amlodipine     megestrol (MEGACE) 40 MG tablet Take 40 mg by mouth daily.     Multiple Vitamin  (MULTI-VITAMIN) tablet Take 1 tablet by mouth every other day. Alternate with vitamin d     pravastatin (PRAVACHOL) 10 MG tablet Take 10 mg by mouth 3 (three) times a week.     VITAMIN D PO Take 1 capsule by mouth every other day. Alternate days with multivitamin     Continuous Blood Gluc Sensor (FREESTYLE LIBRE 2 SENSOR) MISC      meloxicam (MOBIC) 7.5 MG tablet Take 1 tablet (7.5 mg total) by mouth daily. (Patient taking differently: Take 7.5 mg by mouth daily as needed for pain.) 20 tablet 0    Allergies:  Allergies  Allergen Reactions   Pioglitazone Other (See Comments)    Lower extremity edema    Ace Inhibitors Swelling   Canagliflozin Other (See Comments)    Yeast vaginitis    Amoxicillin Nausea And Vomiting   Metformin And Related Swelling    Swelling of lips   Nabumetone Other (See Comments)    Numbness in arms   Penicillins Nausea And Vomiting   Semaglutide Nausea Only    abd cramps    Family History  Problem Relation Age of Onset   CVA Mother    Diabetes Mother    Hypertension Mother    Arthritis/Rheumatoid Mother    Heart failure Father    Diabetes Father  Hypertension Father    Diabetes Brother    Hypertension Sister     Social History:  reports that she has quit smoking. Her smoking use included cigarettes. She started smoking about 5 years ago. She has never used smokeless tobacco. She reports that she does not drink alcohol and does not use drugs.   ROS: As per HPI, all others negative  Blood pressure (!) 186/87, pulse (!) 103, temperature 97.7 F (36.5 C), temperature source Temporal, resp. rate 16, height 5\' 5"  (1.651 m), weight (!) 143.3 kg, SpO2 99 %. General appearance: Obese, NAD NECK:  Supple, thick HEENT:  [place]/AT, anicteric CV:  Regular RESP:  No labored breathing ABD: Protuberant, soft, non-tender NEURO:  A/O x 4, no encephalopathy  Results for orders placed or performed during the hospital encounter of 10/25/22 (from the past 48  hour(s))  Glucose, capillary     Status: Abnormal   Collection Time: 10/25/22 10:31 AM  Result Value Ref Range   Glucose-Capillary 178 (H) 70 - 99 mg/dL    Comment: Glucose reference range applies only to samples taken after fasting for at least 8 hours.   No results found.  Assessment/Plan   Colon cancer screening.  BMI > 50. Colonoscopy, screening. Risks (bleeding, infection, bowel perforation that could require surgery, sedation-related changes in cardiopulmonary systems), benefits (identification and possible treatment of source of symptoms, exclusion of certain causes of symptoms), and alternatives (watchful waiting, radiographic imaging studies, empiric medical treatment) of colonoscopy were explained to patient/family in detail and patient wishes to proceed.   10/27/22 10/25/2022, 10:50 AM

## 2022-10-25 NOTE — Anesthesia Preprocedure Evaluation (Signed)
Anesthesia Evaluation  Patient identified by MRN, date of birth, ID band Patient awake    Reviewed: Allergy & Precautions, NPO status , Patient's Chart, lab work & pertinent test results  Airway Mallampati: III  TM Distance: >3 FB Neck ROM: Full    Dental  (+) Dental Advisory Given   Pulmonary former smoker   breath sounds clear to auscultation       Cardiovascular hypertension, Pt. on medications  Rhythm:Regular Rate:Normal     Neuro/Psych negative neurological ROS     GI/Hepatic negative GI ROS, Neg liver ROS,,,  Endo/Other  diabetes, Type 2, Insulin Dependent    Renal/GU negative Renal ROS     Musculoskeletal   Abdominal   Peds  Hematology negative hematology ROS (+)   Anesthesia Other Findings   Reproductive/Obstetrics                             Anesthesia Physical Anesthesia Plan  ASA: 3  Anesthesia Plan: MAC   Post-op Pain Management: Minimal or no pain anticipated   Induction:   PONV Risk Score and Plan: 2 and Propofol infusion  Airway Management Planned: Natural Airway and Simple Face Mask  Additional Equipment:   Intra-op Plan:   Post-operative Plan: Extubation in OR  Informed Consent: I have reviewed the patients History and Physical, chart, labs and discussed the procedure including the risks, benefits and alternatives for the proposed anesthesia with the patient or authorized representative who has indicated his/her understanding and acceptance.     Dental advisory given  Plan Discussed with:   Anesthesia Plan Comments:        Anesthesia Quick Evaluation

## 2022-10-25 NOTE — Op Note (Signed)
[facility] Patient Name: Destiny Alexander Procedure Date: 10/25/2022 MRN: [id number] Attending MD: Willis Modena , MD, [id number] Date of Birth: 15-Jul-1961 CSN: [id number] Age: 61 Admit Type: Outpatient Procedure:                Colonoscopy Indications:              Screening for colorectal malignant neoplasm, Last                            colonoscopy: 2013 Providers:                Willis Modena, MD, Crawford Givens Rozetta Nunnery, Technician Referring MD:             Willis Modena, MD Medicines:                Monitored Anesthesia Care Complications:            No immediate complications. Estimated Blood Loss:     Estimated blood loss: none. Procedure:                Pre-Anesthesia Assessment:                           - Prior to the procedure, a History and Physical                            was performed, and patient medications and                            allergies were reviewed. The patient's tolerance of                            previous anesthesia was also reviewed. The risks                            and benefits of the procedure and the sedation                            options and risks were discussed with the patient.                            All questions were answered, and informed consent                            was obtained. Prior Anticoagulants: The patient has                            taken no anticoagulant or antiplatelet agents. ASA                            Grade Assessment: III - A patient with severe                            systemic disease.  After reviewing the risks and                            benefits, the patient was deemed in satisfactory                            condition to undergo the procedure.                           After obtaining informed consent, the colonoscope                            was passed under direct vision. Throughout the                            procedure,  the patient's blood pressure, pulse, and                            oxygen saturations were monitored continuously. The                            [id number]-[id number] ([id number] Olympus colonoscope was                            introduced through the anus and advanced to the the                            cecum, identified by appendiceal orifice and                            ileocecal valve. The ileocecal valve, appendiceal                            orifice, and rectum were photographed. The entire                            colon was examined. The colonoscopy was performed                            without difficulty. The patient tolerated the                            procedure well. The quality of the bowel                            preparation was adequate. Scope In: 11:11:29 AM Scope Out: 11:26:25 AM Scope Withdrawal Time: 0 hours 6 minutes 29 seconds  Total Procedure Duration: 0 hours 14 minutes 56 seconds  Findings:      The perianal and digital rectal examinations were normal.      The colon (entire examined portion) appeared normal.      The retroflexed view of the distal rectum and anal verge was normal and       showed no anal or rectal abnormalities. Impression:               -  The entire examined colon is normal.                           - The distal rectum and anal verge are normal on                            retroflexion view.                           - No specimens collected.                           - The entire examined colon is normal on direct and                            retroflexion views. Moderate Sedation:      None Recommendation:           - Patient has a contact number available for                            emergencies. The signs and symptoms of potential                            delayed complications were discussed with the                            patient. Return to normal activities tomorrow.                            Written discharge  instructions were provided to the                            patient.                           - Discharge patient to home (ambulatory).                           - Resume previous diet today.                           - Continue present medications.                           - Repeat colonoscopy in 10 years for screening                            purposes.                           - Return to GI clinic PRN.                           - Return to referring physician as previously  scheduled. Procedure Code(s):        --- Professional ---                           P7106, Colorectal cancer screening; colonoscopy on                            individual not meeting criteria for high risk Diagnosis Code(s):        --- Professional ---                           Z12.11, Encounter for screening for malignant                            neoplasm of colon CPT copyright 2022 [employer]. All rights reserved. The codes documented in this report are preliminary and upon coder review may  be revised to meet current compliance requirements. Willis Modena, MD 10/25/2022 11:32:49 AM This report has been signed electronically. Number of Addenda: 0

## 2022-10-25 NOTE — Anesthesia Postprocedure Evaluation (Signed)
Anesthesia Post Note  Patient: Destiny Alexander  Procedure(s) Performed: COLONOSCOPY WITH PROPOFOL     Patient location during evaluation: PACU Anesthesia Type: MAC Level of consciousness: awake and alert Pain management: pain level controlled Vital Signs Assessment: post-procedure vital signs reviewed and stable Respiratory status: spontaneous breathing, nonlabored ventilation, respiratory function stable and patient connected to nasal cannula oxygen Cardiovascular status: stable and blood pressure returned to baseline Postop Assessment: no apparent nausea or vomiting Anesthetic complications: no  No notable events documented.  Last Vitals:  Vitals:   10/25/22 1150 10/25/22 1152  BP: (!) 179/96 (!) 182/92  Pulse: 93 95  Resp: 19 16  Temp:    SpO2: 100% 100%    Last Pain:  Vitals:   10/25/22 1152  TempSrc:   PainSc: 0-No pain                 Tiajuana Amass

## 2022-10-25 NOTE — Anesthesia Procedure Notes (Signed)
Procedure Name: MAC Date/Time: 10/25/2022 11:03 AM  Performed by: Niel Hummer, CRNAPre-anesthesia Checklist: Patient identified, Suction available, Emergency Drugs available and Patient being monitored Oxygen Delivery Method: Simple face mask

## 2022-10-25 NOTE — Discharge Instructions (Signed)

## 2022-10-27 ENCOUNTER — Encounter (HOSPITAL_COMMUNITY): Payer: Self-pay | Admitting: Gastroenterology

## 2022-10-27 DIAGNOSIS — F330 Major depressive disorder, recurrent, mild: Secondary | ICD-10-CM | POA: Diagnosis not present

## 2022-11-23 DIAGNOSIS — F330 Major depressive disorder, recurrent, mild: Secondary | ICD-10-CM | POA: Diagnosis not present

## 2022-12-07 DIAGNOSIS — R051 Acute cough: Secondary | ICD-10-CM | POA: Diagnosis not present

## 2023-04-15 ENCOUNTER — Emergency Department (HOSPITAL_COMMUNITY): Payer: No Typology Code available for payment source

## 2023-04-15 ENCOUNTER — Other Ambulatory Visit: Payer: Self-pay

## 2023-04-15 ENCOUNTER — Emergency Department (HOSPITAL_COMMUNITY)
Admission: EM | Admit: 2023-04-15 | Discharge: 2023-04-16 | Disposition: A | Discharge status: Home / Self Care | Payer: No Typology Code available for payment source | Attending: Emergency Medicine | Admitting: Emergency Medicine

## 2023-04-15 DIAGNOSIS — Z79899 Other long term (current) drug therapy: Secondary | ICD-10-CM | POA: Diagnosis not present

## 2023-04-15 DIAGNOSIS — M7918 Myalgia, other site: Secondary | ICD-10-CM | POA: Insufficient documentation

## 2023-04-15 DIAGNOSIS — I10 Essential (primary) hypertension: Secondary | ICD-10-CM | POA: Diagnosis not present

## 2023-04-15 DIAGNOSIS — E119 Type 2 diabetes mellitus without complications: Secondary | ICD-10-CM | POA: Insufficient documentation

## 2023-04-15 LAB — BASIC METABOLIC PANEL
Anion gap: 10 (ref 5–15)
BUN: 11 mg/dL (ref 6–20)
CO2: 24 mmol/L (ref 22–32)
Calcium: 9.2 mg/dL (ref 8.9–10.3)
Chloride: 105 mmol/L (ref 98–111)
Creatinine, Ser: 0.97 mg/dL (ref 0.44–1.00)
GFR, Estimated: >60 mL/min (ref >60)
Glucose, Bld: 177 mg/dL — ABNORMAL HIGH (ref 70–99)
Potassium: 3.4 mmol/L — ABNORMAL LOW (ref 3.5–5.1)
Sodium: 139 mmol/L (ref 135–145)

## 2023-04-15 LAB — I-STAT BETA HCG BLOOD, ED (MC, WL, AP ONLY): I-stat hCG, quantitative: <5 m[IU]/mL (ref <5)

## 2023-04-15 LAB — CBC
HCT: 31.9 % — ABNORMAL LOW (ref 36.0–46.0)
Hemoglobin: 10.2 g/dL — ABNORMAL LOW (ref 12.0–15.0)
MCH: 26.2 pg (ref 26.0–34.0)
MCHC: 32 g/dL (ref 30.0–36.0)
MCV: 82 fL (ref 80.0–100.0)
Platelets: 304 10*3/uL (ref 150–400)
RBC: 3.89 MIL/uL (ref 3.87–5.11)
RDW: 17 % — ABNORMAL HIGH (ref 11.5–15.5)
WBC: 11.8 10*3/uL — ABNORMAL HIGH (ref 4.0–10.5)
nRBC: 0 % (ref 0.0–0.2)

## 2023-04-15 LAB — TROPONIN I (HIGH SENSITIVITY): Troponin I (High Sensitivity): 13 ng/L (ref <18)

## 2023-04-15 NOTE — ED Triage Notes (Signed)
Pt arrives with c/o muscle pain after carrying a backpack around last week. Pt endorses neck, shoulder, back, and chest pain. Pt denies sick symptoms.

## 2023-04-16 LAB — CK: Total CK: 128 U/L (ref 38–234)

## 2023-04-16 LAB — TROPONIN I (HIGH SENSITIVITY): Troponin I (High Sensitivity): 15 ng/L (ref <18)

## 2023-04-16 MED ORDER — MELOXICAM 7.5 MG PO TABS: 7.5000 mg | ORAL_TABLET | Freq: Every day | ORAL | 0 refills | Status: AC

## 2023-04-16 MED ORDER — METHOCARBAMOL 500 MG PO TABS: 500.0000 mg | ORAL_TABLET | Freq: Two times a day (BID) | ORAL | 0 refills | Status: DC | PRN

## 2023-04-16 MED ORDER — OXYCODONE-ACETAMINOPHEN 5-325 MG PO TABS
1.0000 | ORAL_TABLET | Freq: Once | ORAL | Status: AC
  Administered 2023-04-16: 1 via ORAL
  Filled 2023-04-16: qty 1

## 2023-04-16 MED ORDER — KETOROLAC TROMETHAMINE 15 MG/ML IJ SOLN
15.0000 mg | Freq: Once | INTRAMUSCULAR | Status: AC
  Administered 2023-04-16: 15 mg via INTRAVENOUS
  Filled 2023-04-16: qty 1

## 2023-04-16 MED ORDER — DIAZEPAM 5 MG/ML IJ SOLN
2.5000 mg | Freq: Once | INTRAMUSCULAR | Status: AC
  Administered 2023-04-16: 2.5 mg via INTRAVENOUS
  Filled 2023-04-16: qty 2

## 2023-04-16 NOTE — ED Provider Notes (Signed)
[employer] EMERGENCY DEPARTMENT AT [facility] Provider Note   CSN: [id number] Arrival date & time: 04/15/23  1846     History  Chief Complaint  Patient presents with   Muscle Pain    Destiny Alexander is a 62 y.o. female.  62 year old female presents to the emergency department for evaluation of musculoskeletal pain.  She states that pain began a few days ago and has been persistent.  It has been unrelieved with some leftover muscle relaxers that she had at the house.  She has also tried over-the-counter Lidoderm patches.  She believes that her pain is related to carrying a backpack around for a few days last week.  Discomfort originates in her neck and is aggravated by lateral head movement.  States that the pain has now progressed into her shoulders and upper chest.  She has not had any extremity numbness or paresthesias, extremity weakness, saddle anesthesia, bowel or bladder incontinence, fevers, cough, vision changes.  The history is provided by the patient. No language interpreter was used.  Muscle Pain       Home Medications Prior to Admission medications   Medication Sig Start Date End Date Taking? Authorizing Provider  methocarbamol (ROBAXIN) 500 MG tablet Take 1 tablet (500 mg total) by mouth every 12 (twelve) hours as needed for muscle spasms. 04/16/23  Yes Antony Madura, PA-C  acetaminophen (TYLENOL) 650 MG CR tablet Take 650-1,300 mg by mouth 2 (two) times daily as needed for pain.    [provider]  amLODipine (NORVASC) 5 MG tablet Take 5 mg by mouth every other day. Alternate days with losartan    [provider]  Continuous Blood Gluc Sensor (FREESTYLE LIBRE 2 SENSOR) MISC  12/14/20   [provider]  empagliflozin (JARDIANCE) 25 MG TABS tablet Take 12.5 mg by mouth daily.    [provider]  fluticasone (FLONASE) 50 MCG/ACT nasal spray Place 1 spray into both nostrils daily for 3 days. Patient taking differently: Place 1 spray  into both nostrils daily as needed for allergies. 12/14/21 10/23/22  Gustavus Bryant, FNP  hydrochlorothiazide (MICROZIDE) 12.5 MG capsule Take 12.5 mg by mouth daily. 11/15/20   [provider]  insulin aspart (NOVOLOG FLEXPEN) 100 UNIT/ML FlexPen Inject 35-40 Units into the skin See admin instructions. inject 35 units with breakfast and 40 units with lunch and dinner as directed    [provider]  insulin glargine, 1 Unit Dial, (TOUJEO SOLOSTAR) 300 UNIT/ML Solostar Pen Inject 40 Units into the skin daily.    [provider]  levocetirizine (XYZAL) 5 MG tablet Take 5 mg by mouth daily as needed for allergies.    [provider]  losartan (COZAAR) 100 MG tablet Take 100 mg by mouth every other day. Alternate days with amlodipine    [provider]  megestrol (MEGACE) 40 MG tablet Take 40 mg by mouth daily. 11/18/20   [provider]  meloxicam (MOBIC) 7.5 MG tablet Take 1 tablet (7.5 mg total) by mouth daily. 04/16/23   Antony Madura, PA-C  Multiple Vitamin (MULTI-VITAMIN) tablet Take 1 tablet by mouth every other day. Alternate with vitamin d    [provider]  pravastatin (PRAVACHOL) 10 MG tablet Take 10 mg by mouth 3 (three) times a week.    [provider]  VITAMIN D PO Take 1 capsule by mouth every other day. Alternate days with multivitamin    [provider]      Allergies  Pioglitazone, Ace inhibitors, Canagliflozin, Amoxicillin, Metformin and related, Nabumetone, Penicillins, and Semaglutide    Review of Systems   Review of Systems Ten systems reviewed and are negative for acute change, except as noted in the HPI.    Physical Exam Updated Vital Signs BP 100/83 (BP Location: Right Arm)   Pulse 85   Temp 98.7 F (37.1 C) (Oral)   Resp 18   Wt (!) 143.3 kg   SpO2 95%   BMI 52.59 kg/m   Physical Exam Vitals and nursing note reviewed.  Constitutional:      General: She is not in acute distress.     Appearance: She is well-developed. She is not diaphoretic.     Comments: Nontoxic appearing, obese AA female  HENT:     Head: Normocephalic and atraumatic.  Eyes:     General: No scleral icterus.    Conjunctiva/sclera: Conjunctivae normal.  Cardiovascular:     Rate and Rhythm: Normal rate and regular rhythm.     Pulses: Normal pulses.     Comments: Not tachycardic as noted in triage. Pulmonary:     Effort: Pulmonary effort is normal. No respiratory distress.     Comments: Lungs CTAB. Respirations even and unlabored. No chest wall crepitus. Musculoskeletal:        General: Normal range of motion.     Cervical back: Normal range of motion.  Skin:    General: Skin is warm and dry.     Coloration: Skin is not pale.     Findings: No erythema or rash.  Neurological:     Mental Status: She is alert and oriented to person, place, and time.     Coordination: Coordination normal.     Comments: GCS 15. Speech is goal oriented. Patient has equal grip strength bilaterally with 5/5 strength against resistance in all major muscle groups bilaterally. Normal shoulder shrug against resistance. Sensation to light touch intact. Patient moves extremities without ataxia. Patient ambulatory with steady gait.  Psychiatric:        Behavior: Behavior normal.     ED Results / Procedures / Treatments   Labs (all labs ordered are listed, but only abnormal results are displayed) Labs Reviewed  BASIC METABOLIC PANEL - Abnormal; Notable for the following components:      Result Value   Potassium 3.4 (*)    Glucose, Bld 177 (*)    All other components within normal limits  CBC - Abnormal; Notable for the following components:   WBC 11.8 (*)    Hemoglobin 10.2 (*)    HCT 31.9 (*)    RDW 17.0 (*)    All other components within normal limits  CK  I-STAT BETA HCG BLOOD, ED ([facility], [facility], [facility] ONLY)  TROPONIN I (HIGH SENSITIVITY)  TROPONIN I (HIGH SENSITIVITY)    EKG EKG Interpretation  Date/Time:  Sunday  Apr 15 2023 18:53:56 EDT Ventricular Rate:  102 PR Interval:  146 QRS Duration: 78 QT Interval:  354 QTC Calculation: 461 R Axis:   -2 Text Interpretation: Sinus tachycardia Moderate voltage criteria for LVH, may be normal variant ( R in aVL , Cornell product ) Anterior infarct , age undetermined Abnormal ECG When compared with ECG of 10-Dec-2020 18:30, Left ventricular hypertrophy is now present Confirmed by Dione Booze ([id number]) on 04/16/2023 1:57:30 AM  Radiology DG Chest 2 View  Result Date: 04/15/2023 CLINICAL DATA:  CP EXAM: CHEST - 2 VIEW COMPARISON:  December 10, 2020 FINDINGS: The cardiomediastinal silhouette is unchanged in contour.  No pleural effusion. No pneumothorax. No acute pleuroparenchymal abnormality. Visualized abdomen is unremarkable. Multilevel degenerative changes of the thoracic spine. IMPRESSION: No acute cardiopulmonary abnormality. Electronically Signed   By: Meda Klinefelter M.D.   On: 04/15/2023 19:51    Procedures Procedures    Medications Ordered in ED Medications  ketorolac (TORADOL) 15 MG/ML injection 15 mg (15 mg Intravenous Given 04/16/23 0133)  oxyCODONE-acetaminophen (PERCOCET/ROXICET) 5-325 MG per tablet 1 tablet (1 tablet Oral Given 04/16/23 0131)  diazepam (VALIUM) injection 2.5 mg (2.5 mg Intravenous Given 04/16/23 0134)    ED Course/ Medical Decision Making/ A&P                             Medical Decision Making Amount and/or Complexity of Data Reviewed Labs: ordered. Radiology: ordered.  Risk Prescription drug management.   This patient presents to the ED for concern of MSK pain, this involves an extensive number of treatment options, and is a complaint that carries with it a high risk of complications and morbidity.  The differential diagnosis includes strain/spasm vs rhabdomyolysis vs dehydration vs statin use   Co morbidities that complicate the patient evaluation  Obesity  HTN DM   Additional history obtained:  Additional  history obtained from spouse, at bedside   Lab Tests:  I Ordered, and personally interpreted labs.  The pertinent results include:  WBC 11.8, K 3.4, CBG 177. Negative troponin x 2. Normal CK.   Imaging Studies ordered:  I ordered imaging studies including CXR  I independently visualized and interpreted imaging which showed no acute cardiopulmonary abnormality I agree with the radiologist interpretation   Cardiac Monitoring:  The patient was maintained on a cardiac monitor.  I personally viewed and interpreted the cardiac monitored which showed an underlying rhythm of: sinus tachycardia > NSR   Medicines ordered and prescription drug management:  I ordered medication including Toradol, Percocet and Valium for pain  Reevaluation of the patient after these medicines showed that the patient improved I have reviewed the patients home medicines and have made adjustments as needed   Problem List / ED Course:  Patient complaining of musculoskeletal pain after carrying around a backpack for a number of days.  She is neurovascularly intact without red flags or signs concerning for cord compression.  Remains ambulatory in the ED without issue or assist device.  Tenderness is reproducible on palpation, consistent with musculoskeletal etiology.  Symptoms were managed with an anti-inflammatory, pain medication, and muscle relaxer in the ED with some improvement. Workup for ACS negative while in the ED. CK normal eliminating concern for rhabdomyolysis No overlying skin changes or erythema to suggest cellulitic process.   Reevaluation:  After the interventions noted above, I reevaluated the patient and found that they have :improved   Social Determinants of Health:  Good social support; husband at bedside   Dispostion:  After consideration of the diagnostic results and the patients response to treatment, I feel that the patent would benefit from supportive care and primary care  reassessment. Return precautions discussed and provided. Patient discharged in stable condition with no unaddressed concerns.         Final Clinical Impression(s) / ED Diagnoses Final diagnoses:  Musculoskeletal pain    Rx / DC Orders ED Discharge Orders          Ordered    meloxicam (MOBIC) 7.5 MG tablet  Daily        04/16/23 0311  methocarbamol (ROBAXIN) 500 MG tablet  Every 12 hours PRN        04/16/23 0311              Antony Madura, PA-C 04/16/23 0347    Dione Booze, MD 04/16/23 [phone]

## 2023-04-16 NOTE — Discharge Instructions (Addendum)
Alternate ice and heat to areas of injury 3-4 times per day to limit inflammation and spasm.  Avoid strenuous activity and heavy lifting.  We recommend consistent use of Meloxicam in addition to Robaxin for muscle spasms.  Do not drive or drink alcohol after taking Robaxin as it may make you drowsy and impair your judgment.  We recommend follow-up with a primary care doctor to ensure resolution of symptoms.  Return to the ED for any new or concerning symptoms.

## 2023-04-20 ENCOUNTER — Other Ambulatory Visit: Payer: Self-pay | Admitting: Family Medicine

## 2023-04-20 ENCOUNTER — Ambulatory Visit
Admission: RE | Admit: 2023-04-20 | Discharge: 2023-04-20 | Disposition: A | Discharge status: Home / Self Care | Payer: 59 | Source: Ambulatory Visit | Attending: Family Medicine | Admitting: Family Medicine

## 2023-04-20 DIAGNOSIS — M545 Low back pain, unspecified: Secondary | ICD-10-CM

## 2023-05-01 ENCOUNTER — Other Ambulatory Visit: Payer: Self-pay

## 2023-05-01 ENCOUNTER — Emergency Department (HOSPITAL_BASED_OUTPATIENT_CLINIC_OR_DEPARTMENT_OTHER): Payer: No Typology Code available for payment source

## 2023-05-01 ENCOUNTER — Encounter (HOSPITAL_BASED_OUTPATIENT_CLINIC_OR_DEPARTMENT_OTHER): Payer: Self-pay

## 2023-05-01 ENCOUNTER — Emergency Department (HOSPITAL_BASED_OUTPATIENT_CLINIC_OR_DEPARTMENT_OTHER)
Admission: EM | Admit: 2023-05-01 | Discharge: 2023-05-01 | Disposition: A | Discharge status: Home / Self Care | Payer: No Typology Code available for payment source | Attending: Emergency Medicine | Admitting: Emergency Medicine

## 2023-05-01 DIAGNOSIS — R000 Tachycardia, unspecified: Secondary | ICD-10-CM | POA: Diagnosis present

## 2023-05-01 DIAGNOSIS — Z794 Long term (current) use of insulin: Secondary | ICD-10-CM | POA: Diagnosis not present

## 2023-05-01 LAB — BASIC METABOLIC PANEL
Anion gap: 12 (ref 5–15)
BUN: 17 mg/dL (ref 6–20)
CO2: 25 mmol/L (ref 22–32)
Calcium: 9.7 mg/dL (ref 8.9–10.3)
Chloride: 106 mmol/L (ref 98–111)
Creatinine, Ser: 0.98 mg/dL (ref 0.44–1.00)
GFR, Estimated: >60 mL/min (ref >60)
Glucose, Bld: 100 mg/dL — ABNORMAL HIGH (ref 70–99)
Potassium: 3.8 mmol/L (ref 3.5–5.1)
Sodium: 143 mmol/L (ref 135–145)

## 2023-05-01 LAB — CBC WITH DIFFERENTIAL/PLATELET
Abs Immature Granulocytes: 0.07 10*3/uL (ref 0.00–0.07)
Basophils Absolute: 0 10*3/uL (ref 0.0–0.1)
Basophils Relative: 0 %
Eosinophils Absolute: 0.6 10*3/uL — ABNORMAL HIGH (ref 0.0–0.5)
Eosinophils Relative: 5 %
HCT: 34.6 % — ABNORMAL LOW (ref 36.0–46.0)
Hemoglobin: 11.3 g/dL — ABNORMAL LOW (ref 12.0–15.0)
Immature Granulocytes: 1 %
Lymphocytes Relative: 25 %
Lymphs Abs: 2.6 10*3/uL (ref 0.7–4.0)
MCH: 26.3 pg (ref 26.0–34.0)
MCHC: 32.7 g/dL (ref 30.0–36.0)
MCV: 80.7 fL (ref 80.0–100.0)
Monocytes Absolute: 0.8 10*3/uL (ref 0.1–1.0)
Monocytes Relative: 8 %
Neutro Abs: 6.4 10*3/uL (ref 1.7–7.7)
Neutrophils Relative %: 61 %
Platelets: 329 10*3/uL (ref 150–400)
RBC: 4.29 MIL/uL (ref 3.87–5.11)
RDW: 17.7 % — ABNORMAL HIGH (ref 11.5–15.5)
WBC: 10.6 10*3/uL — ABNORMAL HIGH (ref 4.0–10.5)
nRBC: 0 % (ref 0.0–0.2)

## 2023-05-01 LAB — TSH: TSH: 0.956 u[IU]/mL (ref 0.350–4.500)

## 2023-05-01 LAB — BRAIN NATRIURETIC PEPTIDE: B Natriuretic Peptide: 31.5 pg/mL (ref 0.0–100.0)

## 2023-05-01 LAB — MAGNESIUM: Magnesium: 1.7 mg/dL (ref 1.7–2.4)

## 2023-05-01 LAB — TROPONIN I (HIGH SENSITIVITY): Troponin I (High Sensitivity): 9 ng/L (ref <18)

## 2023-05-01 MED ORDER — DILTIAZEM HCL-DEXTROSE 125-5 MG/125ML-% IV SOLN (PREMIX): 5.0000 mg/h | INTRAVENOUS | Status: DC

## 2023-05-01 MED ORDER — DILTIAZEM LOAD VIA INFUSION
15.0000 mg | Freq: Once | INTRAVENOUS | Status: DC
  Filled 2023-05-01: qty 15

## 2023-05-01 MED ORDER — DILTIAZEM HCL ER COATED BEADS 120 MG PO CP24: 120.0000 mg | ORAL_CAPSULE | Freq: Every day | ORAL | 0 refills | Status: DC

## 2023-05-01 MED ORDER — DILTIAZEM LOAD VIA INFUSION
10.0000 mg | Freq: Once | INTRAVENOUS | Status: DC
  Filled 2023-05-01: qty 10

## 2023-05-01 MED ORDER — IOHEXOL 350 MG/ML SOLN
75.0000 mL | Freq: Once | INTRAVENOUS | Status: AC | PRN
  Administered 2023-05-01: 75 mL via INTRAVENOUS

## 2023-05-01 MED ORDER — DILTIAZEM HCL ER COATED BEADS 120 MG PO CP24
120.0000 mg | ORAL_CAPSULE | Freq: Once | ORAL | Status: AC
  Administered 2023-05-01: 120 mg via ORAL
  Filled 2023-05-01: qty 1

## 2023-05-01 MED ORDER — SODIUM CHLORIDE 0.9 % IV BOLUS: 1000.0000 mL | Freq: Once | INTRAVENOUS | Status: DC

## 2023-05-01 NOTE — ED Triage Notes (Signed)
Patient here POV from Home.  Endorses Tachycardia today. Went to PCP Office for Routine Assessment and was told to seek ED Evaluation for Tachycardia (130).   Some SOB. No CP. No N/V/D. No Fevers.   NAD Noted during Triage. A&Ox4. GCS 15. Ambulatory.

## 2023-05-01 NOTE — Discharge Instructions (Signed)
You will discontinue the amlodipine and replace it with diltiazem daily.  Follow-up with cardiology.  Please return if symptoms worsen.

## 2023-05-01 NOTE — ED Provider Notes (Signed)
[place] [facility]ERGENCY DEPARTMENT AT [facility] Provider Note   CSN: [id number] Arrival date & time: 05/01/23  1645     History  Chief Complaint  Patient presents with   Tachycardia    Destiny Alexander is a 62 y.o. female.  Patient here with fast heart rate.  She was feeling a little bit lightheaded today and had gone to her primary care doctor to have her blood sugar checked and they found her to have a heart rate in the 130s.  She arrives here with fast heart rate as well.  She has no chest pain or shortness of breath.  She is not sure when she had fast heart rate.  Denies any weakness numbness tingling.  Headache or shortness of breath.  The history is provided by the patient.       Home Medications Prior to Admission medications   Medication Sig Start Date End Date Taking? Authorizing Provider  diltiazem (CARDIZEM CD) 120 MG 24 hr capsule Take 1 capsule (120 mg total) by mouth daily. 05/01/23 05/31/23 Yes Wildon Cuevas, DO  acetaminophen (TYLENOL) 650 MG CR tablet Take 650-1,300 mg by mouth 2 (two) times daily as needed for pain.    [provider]  Continuous Blood Gluc Sensor (FREESTYLE LIBRE 2 SENSOR) MISC  12/14/20   [provider]  empagliflozin (JARDIANCE) 25 MG TABS tablet Take 12.5 mg by mouth daily.    [provider]  fluticasone (FLONASE) 50 MCG/ACT nasal spray Place 1 spray into both nostrils daily for 3 days. Patient taking differently: Place 1 spray into both nostrils daily as needed for allergies. 12/14/21 10/23/22  Gustavus Bryant, FNP  hydrochlorothiazide (MICROZIDE) 12.5 MG capsule Take 12.5 mg by mouth daily. 11/15/20   [provider]  insulin aspart (NOVOLOG FLEXPEN) 100 UNIT/ML FlexPen Inject 35-40 Units into the skin See admin instructions. inject 35 units with breakfast and 40 units with lunch and dinner as directed    [provider]  insulin glargine, 1 Unit Dial, (TOUJEO SOLOSTAR) 300 UNIT/ML Solostar Pen  Inject 40 Units into the skin daily.    [provider]  levocetirizine (XYZAL) 5 MG tablet Take 5 mg by mouth daily as needed for allergies.    [provider]  losartan (COZAAR) 100 MG tablet Take 100 mg by mouth every other day. Alternate days with amlodipine    [provider]  megestrol (MEGACE) 40 MG tablet Take 40 mg by mouth daily. 11/18/20   [provider]  meloxicam (MOBIC) 7.5 MG tablet Take 1 tablet (7.5 mg total) by mouth daily. 04/16/23   Antony Madura, PA-C  methocarbamol (ROBAXIN) 500 MG tablet Take 1 tablet (500 mg total) by mouth every 12 (twelve) hours as needed for muscle spasms. 04/16/23   Antony Madura, PA-C  Multiple Vitamin (MULTI-VITAMIN) tablet Take 1 tablet by mouth every other day. Alternate with vitamin d    [provider]  pravastatin (PRAVACHOL) 10 MG tablet Take 10 mg by mouth 3 (three) times a week.    [provider]  VITAMIN D PO Take 1 capsule by mouth every other day. Alternate days with multivitamin    [provider]      Allergies    Pioglitazone, Ace inhibitors, Canagliflozin, Amoxicillin, Metformin and related, Nabumetone, Penicillins, and Semaglutide    Review of Systems   Review of Systems  Physical Exam Updated Vital Signs BP (!) 153/88   Pulse (!) 132   Temp 99 F (37.2 C) (  Oral)   Resp 18   Ht 5\' 5"  (1.651 m)   Wt (!) 147 kg   SpO2 95%   BMI 53.92 kg/m  Physical Exam Vitals and nursing note reviewed.  Constitutional:      General: She is not in acute distress.    Appearance: She is well-developed. She is not ill-appearing.  HENT:     Head: Normocephalic and atraumatic.     Nose: Nose normal.     Mouth/Throat:     Mouth: Mucous membranes are moist.  Eyes:     Extraocular Movements: Extraocular movements intact.     Conjunctiva/sclera: Conjunctivae normal.     Pupils: Pupils are equal, round, and reactive to light.  Cardiovascular:     Rate and Rhythm: Regular rhythm.  Tachycardia present.     Heart sounds: No murmur heard. Pulmonary:     Effort: Pulmonary effort is normal. No respiratory distress.     Breath sounds: Normal breath sounds.  Abdominal:     Palpations: Abdomen is soft.     Tenderness: There is no abdominal tenderness.  Musculoskeletal:        General: No swelling. Normal range of motion.     Cervical back: Normal range of motion and neck supple.  Skin:    General: Skin is warm and dry.     Capillary Refill: Capillary refill takes less than 2 seconds.  Neurological:     General: No focal deficit present.     Mental Status: She is alert and oriented to person, place, and time.     Cranial Nerves: No cranial nerve deficit.     Sensory: No sensory deficit.     Motor: No weakness.     Coordination: Coordination normal.     Gait: Gait normal.     Comments: 5+ out of 5 strength throughout, normal sensation, no drift, normal finger-nose-finger, normal speech  Psychiatric:        Mood and Affect: Mood normal.     ED Results / Procedures / Treatments   Labs (all labs ordered are listed, but only abnormal results are displayed) Labs Reviewed  CBC WITH DIFFERENTIAL/PLATELET - Abnormal; Notable for the following components:      Result Value   WBC 10.6 (*)    Hemoglobin 11.3 (*)    HCT 34.6 (*)    RDW 17.7 (*)    Eosinophils Absolute 0.6 (*)    All other components within normal limits  BASIC METABOLIC PANEL - Abnormal; Notable for the following components:   Glucose, Bld 100 (*)    All other components within normal limits  TSH  MAGNESIUM  BRAIN NATRIURETIC PEPTIDE  TROPONIN I (HIGH SENSITIVITY)    EKG EKG Interpretation  Date/Time:  Tuesday May 01 2023 16:58:43 EDT Ventricular Rate:  133 PR Interval:  125 QRS Duration: 84 QT Interval:  318 QTC Calculation: 473 R Axis:   37 Text Interpretation: Atrial tachycardia Confirmed by Virgina Norfolk ([id number]) on 05/01/2023 5:03:17 PM  Radiology CT Angio Chest PE W and/or Wo  Contrast  Result Date: 05/01/2023 CLINICAL DATA:  Chest pain and shortness of breath.  Tachycardia. EXAM: CT ANGIOGRAPHY CHEST WITH CONTRAST TECHNIQUE: Multidetector CT imaging of the chest was performed using the standard protocol during bolus administration of intravenous contrast. Multiplanar CT image reconstructions and MIPs were obtained to evaluate the vascular anatomy. RADIATION DOSE REDUCTION: This exam was performed according to the departmental dose-optimization program which includes automated exposure control, adjustment of the mA and/or kV  according to patient size and/or use of iterative reconstruction technique. CONTRAST:  75mL OMNIPAQUE IOHEXOL 350 MG/ML SOLN COMPARISON:  12/10/2020 FINDINGS: Cardiovascular: Mild cardiac enlargement. This is mainly due to left ventricular enlargement. No pericardial effusion. The aorta is normal in caliber. No dissection or atherosclerotic calcification. Minimal scattered coronary artery calcifications. Suboptimal opacification of the pulmonary arteries. No large central pulmonary emboli are identified. Mediastinum/Nodes: No mediastinal or hilar mass or lymphadenopathy. The esophagus is grossly. Lungs/Pleura: No acute pulmonary process. No infiltrates, edema or effusions. No pulmonary lesions. Upper Abdomen: No significant upper abdominal findings. Musculoskeletal: No medial breast masses, supraclavicular or axillary adenopathy. The bony thorax is intact. Review of the MIP images confirms the above findings. IMPRESSION: 1. Suboptimal opacification of the pulmonary arteries. No large central pulmonary emboli are identified. 2. Normal thoracic aorta. 3. Mild cardiac enlargement, mainly due to left ventricular enlargement. 4. No acute pulmonary findings. Electronically Signed   By: Rudie Meyer M.D.   On: 05/01/2023 18:44   DG Chest Portable 1 View  Result Date: 05/01/2023 CLINICAL DATA:  Shortness of breath EXAM: PORTABLE CHEST 1 VIEW COMPARISON:  X-ray  04/15/2023.  Older CT scans FINDINGS: Borderline cardiopericardial silhouette with tortuous ectatic aorta. No consolidation, pneumothorax, effusion or edema. Overlapping cardiac leads. Degenerative changes of the spine. IMPRESSION: Borderline cardiopericardial silhouette with tortuous aorta. Electronically Signed   By: Maciah Kays M.D.   On: 05/01/2023 17:53    Procedures Procedures    Medications Ordered in ED Medications  iohexol (OMNIPAQUE) 350 MG/ML injection 75 mL (75 mLs Intravenous Contrast Given 05/01/23 1815)  diltiazem (CARDIZEM CD) 24 hr capsule 120 mg (120 mg Oral Given 05/01/23 1936)    ED Course/ Medical Decision Making/ A&P                             Medical Decision Making Amount and/or Complexity of Data Reviewed Labs: ordered. Radiology: ordered.  Risk Prescription drug management.   Destiny Alexander is here with palpitations, tachycardia.  Heart rate 130.  Appears to be an atrial tachycardia on my review and interpretation of EKG.  She is not having any chest pain or shortness of breath.  My suspicion is that this is atrial tachycardia.  Lab work showed no significant anemia or electrolyte abnormality or kidney injury.  Troponin normal.  BNP normal.  No signs of volume overload on chest x-ray.  PE scan was performed with no evidence of PE.  Overall patient converted back to sinus rhythm on her own.  Repeat EKG shows a sinus tachycardia at 103.  I talked with Dr. Anne Fu with cardiology and ultimately will put her on diltiazem 120 mg daily.  She will follow-up with cardiology outpatient.  It sounds like she might of had issues like this in the past where there was concern for may be SVT or atrial tachycardia.  Overall workup today is unremarkable.  Suspect that this is a idiopathic process.  She understands return precautions.  Discharged in good condition.  This chart was dictated using voice recognition software.  Despite best efforts to proofread,  errors can occur which  can change the documentation meaning.         Final Clinical Impression(s) / ED Diagnoses Final diagnoses:  Tachycardia    Rx / DC Orders ED Discharge Orders          Ordered    diltiazem (CARDIZEM CD) 120 MG 24 hr capsule  Daily  05/01/23 1938    Ambulatory referral to Cardiology       Comments: If you have not heard from the Cardiology office within the next 72 hours please call [phone].   05/01/23 1939              Virgina Norfolk, DO 05/01/23 1941

## 2023-05-01 NOTE — ED Notes (Signed)
RN reviewed discharge instructions with pt. Pt verbalized understanding and had no further questions. VSS upon discharge.  

## 2023-07-01 ENCOUNTER — Encounter: Payer: Self-pay | Admitting: Cardiovascular Disease

## 2023-07-01 NOTE — Progress Notes (Unsigned)
  Cardiology Office Note:  .   Date:  07/02/2023  ID:  Destiny Alexander, DOB 02/28/1961, MRN [id number] PCP: Sigmund Hazel, MD  [facility] Providers Cardiologist:  new to Nayana Lenig  Click to update primary MD,subspecialty MD or APP then REFRESH:1}   History of Present Illness: Destiny Alexander Kitchen   Destiny Alexander is a 62 y.o. female with hx of SVT. Has seen Jorja Loa, [place] in the past  She was seen in the ER in May, 2024 for tachycardia She had been sent over from her primary MD with a HR of 130s ECG revealed atrial tachycardia  Went to the ER in May, 2024 HR was in the 130s Chest CTA was negative for PE Was started on Diltiazem 120 mg a day for 1 month.  She did not have any refills   HR increased when she drinks caffeine   Walks on occasion   No CP  Does have DOE Has gained some weight recently  Is on Megace for uterine bleeding,  this is also contributing to her weight gain .   Echo from Jan, 2022 - showed normal LV function  No significant valvular disease       ROS:    Studies Reviewed: .          Risk Assessment/Calculations:             Physical Exam:   VS:  BP 122/60   Pulse (!) 110   Ht 5\' 5"  (1.651 m)   Wt (!) 330 lb (149.7 kg)   SpO2 98%   BMI 54.91 kg/m    Wt Readings from Last 3 Encounters:  07/02/23 (!) 330 lb (149.7 kg)  05/01/23 (!) 324 lb (147 kg)  04/15/23 (!) 316 lb (143.3 kg)    GEN: Well nourished, well developed in no acute distress NECK: No JVD; No carotid bruits CARDIAC: RRR, no murmurs, rubs, gallops RESPIRATORY:  Clear to auscultation without rales, wheezing or rhonchi  ABDOMEN: Soft, non-tender, non-distended EXTREMITIES:  No edema; No deformity   ASSESSMENT AND PLAN: .    Sinus tachycardia-  will add Toprol 25 mg a day  I suspect her tachycardia is due to her obesity   2.  Morbid obesity : Eats poorly .  Will have her see Eligha Bridegroom, NP for follow up of her HTN and obesity   3.   HTN:    cont meds.   Will follow up with Benjamine Sprague , NP In several months         Dispo: Eligha Bridegroom, NP in 3 months   Signed, Kristeen Miss, MD

## 2023-07-02 ENCOUNTER — Ambulatory Visit
Payer: No Typology Code available for payment source | Attending: Cardiovascular Disease | Admitting: Cardiovascular Disease

## 2023-07-02 ENCOUNTER — Encounter: Payer: Self-pay | Admitting: Cardiovascular Disease

## 2023-07-02 VITALS — BP 122/60 | HR 110 | Ht 65.0 in | Wt 330.0 lb

## 2023-07-02 DIAGNOSIS — I10 Essential (primary) hypertension: Secondary | ICD-10-CM | POA: Diagnosis not present

## 2023-07-02 DIAGNOSIS — R000 Tachycardia, unspecified: Secondary | ICD-10-CM | POA: Diagnosis not present

## 2023-07-02 DIAGNOSIS — Z6841 Body Mass Index (BMI) 40.0 and over, adult: Secondary | ICD-10-CM | POA: Diagnosis not present

## 2023-07-02 MED ORDER — METOPROLOL SUCCINATE ER 25 MG PO TB24: 25.0000 mg | ORAL_TABLET | Freq: Every day | ORAL | 3 refills | Status: DC

## 2023-07-02 NOTE — Patient Instructions (Addendum)
Medication Instructions:  Your physician has recommended you make the following change in your medication:   1) STOP diltiazem (Cardizem) 2) START metoprolol succinate (Toprol XL) 25mg  daily  *If you need a refill on your cardiac medications before your next appointment, please call your pharmacy*  Lab Work: None ordered today.  Testing/Procedures: None ordered today.  Follow-Up: At [facility], you and your health needs are our priority.  As part of our continuing mission to provide you with exceptional heart care, we have created designated Provider Care Teams.  These Care Teams include your primary Cardiologist (physician) and Advanced Practice Providers (APPs -  Physician Assistants and Nurse Practitioners) who all work together to provide you with the care you need, when you need it.  Your next appointment:   2-3 month(s)  The format for your next appointment:   In Person  Provider:   Eligha Bridegroom, NP

## 2023-08-09 ENCOUNTER — Telehealth: Payer: Self-pay | Admitting: Emergency Medicine

## 2023-08-09 ENCOUNTER — Ambulatory Visit
Admission: EM | Admit: 2023-08-09 | Discharge: 2023-08-09 | Disposition: A | Discharge status: Home / Self Care | Payer: No Typology Code available for payment source | Attending: Internal Medicine | Admitting: Internal Medicine

## 2023-08-09 DIAGNOSIS — U071 COVID-19: Secondary | ICD-10-CM | POA: Insufficient documentation

## 2023-08-09 DIAGNOSIS — J069 Acute upper respiratory infection, unspecified: Secondary | ICD-10-CM | POA: Diagnosis not present

## 2023-08-09 DIAGNOSIS — R059 Cough, unspecified: Secondary | ICD-10-CM | POA: Diagnosis present

## 2023-08-09 LAB — SARS CORONAVIRUS 2 (TAT 6-24 HRS): SARS Coronavirus 2: POSITIVE — AB

## 2023-08-09 MED ORDER — GUAIFENESIN 200 MG PO TABS: 200.0000 mg | ORAL_TABLET | ORAL | 0 refills | Status: DC | PRN

## 2023-08-09 NOTE — Telephone Encounter (Signed)
Pt called stating that pharmacy hadnt received her prescription we sent in for her earlier.  This RN called pharmacy and was able to give verbal over the phone for her prescription.  Called patient making her aware that pharmacy was given order verbally over the phone. I did make her aware that per pharmacy, her prescription may not be covered by her insurance due to being an OTC medication. Pt verbalized understanding.

## 2023-08-09 NOTE — ED Triage Notes (Signed)
Presents to the office for cough, nasal congestion and sore throat x 3 days.

## 2023-08-09 NOTE — ED Provider Notes (Signed)
[facility]    CSN: [id number] Arrival date & time: 08/09/23  0800      History   Chief Complaint Chief Complaint  Patient presents with   Nasal Congestion   Cough    HPI Destiny Alexander is a 62 y.o. female.   Patient presents with 3-day history of nasal congestion and coughing.  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  Denies any fever at home.  Denies history of asthma or COPD and patient does not smoke cigarettes. denies chest pain, shortness of breath.  She has taken her Xyzal for symptoms with minimal improvement.   Cough   Past Medical History:  Diagnosis Date   Diabetes mellitus    Hypertension     Patient Active Problem List   Diagnosis Date Noted   Sinus tachycardia 07/02/2023   SVT (supraventricular tachycardia) 12/17/2020   Post concussion syndrome 01/08/2017   Type 2 diabetes mellitus without complication, with long-term current use of insulin (HCC) 01/08/2017   Essential hypertension 01/08/2017   BMI 50.0-59.9, adult (HCC) 01/08/2017   Concussion and edema of cervical spinal cord (HCC) 01/08/2017    Past Surgical History:  Procedure Laterality Date   APPENDECTOMY     CESAREAN SECTION     x 2   CHOLECYSTECTOMY     COLONOSCOPY WITH PROPOFOL N/A 10/25/2022   Procedure: COLONOSCOPY WITH PROPOFOL;  Surgeon: Willis Modena, MD;  Location: [facility];  Service: Gastroenterology;  Laterality: N/A;   LIVER SURGERY     "piece of liver removed w/choley"   TUBAL LIGATION      OB History   No obstetric history on file.      Home Medications    Prior to Admission medications   Medication Sig Start Date End Date Taking? Authorizing Provider  acetaminophen (TYLENOL) 650 MG CR tablet Take 650-1,300 mg by mouth 2 (two) times daily as needed for pain.   Yes [provider]  amLODipine (NORVASC) 5 MG tablet Take 5 mg by mouth daily.   Yes [provider]  Continuous Blood Gluc  Sensor (FREESTYLE LIBRE 2 SENSOR) MISC  12/14/20  Yes [provider]  empagliflozin (JARDIANCE) 25 MG TABS tablet Take 12.5 mg by mouth daily.   Yes [provider]  FARXIGA 5 MG TABS tablet Take 5 mg by mouth daily. 01/23/23  Yes [provider]  guaiFENesin 200 MG tablet Take 1 tablet (200 mg total) by mouth every 4 (four) hours as needed for cough or to loosen phlegm. 08/09/23  Yes Lotus Gover, Rolly Salter E, FNP  hydrochlorothiazide (MICROZIDE) 12.5 MG capsule Take 12.5 mg by mouth daily. 11/15/20  Yes [provider]  insulin aspart (NOVOLOG FLEXPEN) 100 UNIT/ML FlexPen Inject 35-40 Units into the skin See admin instructions. inject 35 units with breakfast and 40 units with lunch and dinner as directed   Yes [provider]  insulin glargine, 1 Unit Dial, (TOUJEO SOLOSTAR) 300 UNIT/ML Solostar Pen Inject 40 Units into the skin daily.   Yes [provider]  levocetirizine (XYZAL) 5 MG tablet Take 5 mg by mouth daily as needed for allergies.   Yes [provider]  losartan (COZAAR) 100 MG tablet Take 100 mg by mouth every other day. Alternate days with amlodipine   Yes [provider]  megestrol (MEGACE) 40 MG tablet Take 40 mg by mouth daily. 11/18/20  Yes [provider]  meloxicam (MOBIC) 7.5 MG tablet Take 1 tablet (  7.5 mg total) by mouth daily. 04/16/23  Yes Antony Madura, PA-C  methocarbamol (ROBAXIN) 500 MG tablet Take 1 tablet (500 mg total) by mouth every 12 (twelve) hours as needed for muscle spasms. 04/16/23  Yes Antony Madura, PA-C  metoprolol succinate (TOPROL XL) 25 MG 24 hr tablet Take 1 tablet (25 mg total) by mouth daily. 07/02/23  Yes Nahser, Deloris Ping, MD  MOUNJARO 2.5 MG/0.5ML Pen Inject 2.5 mg into the skin once a week.   Yes [provider]  Multiple Vitamin (MULTI-VITAMIN) tablet Take 1 tablet by mouth every other day. Alternate with vitamin d   Yes [provider]  pravastatin (PRAVACHOL) 10 MG tablet  Take 10 mg by mouth 3 (three) times a week.   Yes [provider]  VITAMIN D PO Take 1 capsule by mouth every other day. Alternate days with multivitamin   Yes [provider]  fluticasone (FLONASE) 50 MCG/ACT nasal spray Place 1 spray into both nostrils daily for 3 days. Patient taking differently: Place 1 spray into both nostrils daily as needed for allergies. 12/14/21 10/23/22  Gustavus Bryant, FNP  rosuvastatin (CRESTOR) 10 MG tablet 1 tablet Oral Once a day    [provider]    Family History Family History  Problem Relation Age of Onset   CVA Mother    Diabetes Mother    Hypertension Mother    Arthritis/Rheumatoid Mother    Heart failure Father    Diabetes Father    Hypertension Father    Diabetes Brother    Hypertension Sister     Social History Social History   Tobacco Use   Smoking status: Former    Types: Cigarettes    Start date: 11/24/2016   Smokeless tobacco: Never   Tobacco comments:    quit 1985  Substance Use Topics   Alcohol use: No    Comment: quit 1985   Drug use: No     Allergies   Pioglitazone, Ace inhibitors, Canagliflozin, Amoxicillin, Metformin and related, Nabumetone, Penicillins, and Semaglutide   Review of Systems Review of Systems Per HPI  Physical Exam Triage Vital Signs ED Triage Vitals [08/09/23 0814]  Encounter Vitals Group     BP (!) 157/81     Systolic BP Percentile      Diastolic BP Percentile      Pulse Rate (!) 111     Resp 16     Temp 98.6 F (37 C)     Temp Source Oral     SpO2 98 %     Weight      Height      Head Circumference      Peak Flow      Pain Score      Pain Loc      Pain Education      Exclude from Growth Chart    No data found.  Updated Vital Signs BP (!) 157/81 (BP Location: Left Arm)   Pulse (!) 111   Temp 98.6 F (37 C) (Oral)   Resp 16   SpO2 98%   Visual Acuity Right Eye Distance:   Left Eye Distance:   Bilateral Distance:    Right Eye Near:   Left Eye  Near:    Bilateral Near:     Physical Exam Constitutional:      General: She is not in acute distress.    Appearance: Normal appearance. She is not toxic-appearing or diaphoretic.  HENT:     Head: Normocephalic  and atraumatic.     Right Ear: Tympanic membrane and ear canal normal.     Left Ear: Tympanic membrane and ear canal normal.     Nose: Congestion present.     Mouth/Throat:     Mouth: Mucous membranes are moist.     Pharynx: Posterior oropharyngeal erythema present.  Eyes:     Extraocular Movements: Extraocular movements intact.     Conjunctiva/sclera: Conjunctivae normal.     Pupils: Pupils are equal, round, and reactive to light.  Cardiovascular:     Rate and Rhythm: Normal rate and regular rhythm.     Pulses: Normal pulses.     Heart sounds: Normal heart sounds.  Pulmonary:     Effort: Pulmonary effort is normal. No respiratory distress.     Breath sounds: Normal breath sounds. No wheezing.  Abdominal:     General: Abdomen is flat. Bowel sounds are normal.     Palpations: Abdomen is soft.  Musculoskeletal:        General: Normal range of motion.     Cervical back: Normal range of motion.  Skin:    General: Skin is warm and dry.  Neurological:     General: No focal deficit present.     Mental Status: She is alert and oriented to person, place, and time. Mental status is at baseline.  Psychiatric:        Mood and Affect: Mood normal.        Behavior: Behavior normal.      UC Treatments / Results  Labs (all labs ordered are listed, but only abnormal results are displayed) Labs Reviewed  SARS CORONAVIRUS 2 (TAT 6-24 HRS)    EKG   Radiology No results found.  Procedures Procedures (including critical care time)  Medications Ordered in UC Medications - No data to display  Initial Impression / Assessment and Plan / UC Course  I have reviewed the triage vital signs and the nursing notes.  Pertinent labs & imaging results that were available during  my care of the patient were reviewed by me and considered in my medical decision making (see chart for details).     Patient presents with symptoms likely from a viral upper respiratory infection.  Do not suspect underlying cardiopulmonary process. Symptoms seem unlikely related to ACS, CHF or COPD exacerbations, pneumonia, pneumothorax. Patient is nontoxic appearing and not in need of emergent medical intervention. Covid test pending.   Recommended symptom control with medications and supportive care. Patient was sent guaifenesin.   Return if symptoms fail to improve in 1-2 weeks or you develop shortness of breath, chest pain, severe headache. Patient states understanding and is agreeable.  Discharged with PCP followup.  Final Clinical Impressions(s) / UC Diagnoses   Final diagnoses:  Viral upper respiratory tract infection with cough     Discharge Instructions      It appears that you have a viral illness that should run its course.  I have prescribed you medication to help alleviate symptoms.  COVID test is pending.    ED Prescriptions     Medication Sig Dispense Auth. Provider   guaiFENesin 200 MG tablet Take 1 tablet (200 mg total) by mouth every 4 (four) hours as needed for cough or to loosen phlegm. 30 tablet [place], Acie Fredrickson, [place]      PDMP not reviewed this encounter.   Gustavus Bryant, [place] 08/09/23 [phone]

## 2023-08-09 NOTE — Discharge Instructions (Signed)
It appears that you have a viral illness that should run its course.  I have prescribed you medication to help alleviate symptoms.  COVID test is pending.

## 2023-08-10 ENCOUNTER — Telehealth: Payer: Self-pay

## 2023-08-10 MED ORDER — PROMETHAZINE-DM 6.25-15 MG/5ML PO SYRP: 5.0000 mL | ORAL_SOLUTION | Freq: Four times a day (QID) | ORAL | 0 refills | Status: DC

## 2023-08-10 NOTE — Telephone Encounter (Signed)
When pt was notified of +covid result, she requested cough medicine. Per VO by L. Lequita Halt, PA-C, "Promethazine DM 5 mg every 6 hours for cough"  Reviewed with patient, verified pharmacy, prescription sent

## 2023-09-11 NOTE — H&P (Signed)
Destiny Alexander is an 62 y.o. No obstetric history on file. who is admitted for ***.  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   MEDICAL/FAMILY/SOCIAL HX: No LMP recorded.    Past Medical History:  Diagnosis Date   Diabetes mellitus    Hypertension     Past Surgical History:  Procedure Laterality Date   APPENDECTOMY     CESAREAN SECTION     x 2   CHOLECYSTECTOMY     COLONOSCOPY WITH PROPOFOL N/A 10/25/2022   Procedure: COLONOSCOPY WITH PROPOFOL;  Surgeon: Willis Modena, MD;  Location: [facility];  Service: Gastroenterology;  Laterality: N/A;   LIVER SURGERY     "piece of liver removed w/choley"   TUBAL LIGATION      Family History  Problem Relation Age of Onset   CVA Mother    Diabetes Mother    Hypertension Mother    Arthritis/Rheumatoid Mother    Heart failure Father    Diabetes Father    Hypertension Father    Diabetes Brother    Hypertension Sister     Social History:  reports that she has quit smoking. Her smoking use included cigarettes. She started smoking about 6 years ago. She has never used smokeless tobacco. She reports that she does not drink alcohol and does not use drugs.  ALLERGIES/MEDS:  Allergies:   Allergies  Allergen Reactions   Pioglitazone Other (See Comments)    Lower extremity edema    Ace Inhibitors Swelling   Canagliflozin Other (See Comments)    Yeast vaginitis    Amoxicillin Nausea And Vomiting   Metformin And Related Swelling    Swelling of lips   Nabumetone Other (See Comments)    Numbness in arms   Penicillins Nausea And Vomiting   Semaglutide Nausea Only    abd cramps    No medications prior to admission.     ROS  There were no vitals taken for this visit. Physical Exam  No results found for this or any previous visit (from the past 24 hour(s)).  No results found.   ASSESSMENT/PLAN: Destiny Alexander is a 62 y.o. No obstetric history on file. who is admitted for ***   Steva Ready, DO

## 2023-09-12 NOTE — Progress Notes (Signed)
Cardiology Office Note:  .   Date:  09/14/2023  ID:  Destiny Alexander, DOB Jun 09, 1961, MRN [id number] PCP: Sigmund Hazel, MD  [facility] Providers Cardiologist:  Kristeen Miss, MD    Patient Profile: .      PMH Atrial tachycardia Morbid obesity Hypertension  She has a history of SVT and was seen via MyChart by Jorja Loa, PA with [facility]-fib clinic.  Seen in the ER May 2024 for tachycardia.  She had been sent over from her primary care provider with HR 130s.  ECG revealed atrial tachycardia.  CTA was negative for PE.  She was started on diltiazem 120 mg for 1 month, she did not have any refills.  Heart rate is increased when she drinks caffeine.  She is on Megace for uterine bleeding and feels this is contributing to her weight gain.  Echo from January 2022 showed normal LV function and no significant valvular disease.  Referred to primary cardiology and seen by Dr. Elease Hashimoto 07/02/2023.  Metoprolol 25 mg daily was added for management of tachycardia.  She had admitted to a poor diet and no regular exercise.  She was encouraged to follow-up in 3 months.       History of Present Illness: Destiny Alexander   Destiny Alexander is a very pleasant  62 y.o. female who is here today for 3 month follow-up. She does not have a BP monitor at home so she has not been tracking BP.  Reports she is feeling well.  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 Has tried to make some dietary improvements and sometimes plans healthy meals but also continues to eat fast food. Particularly likes bacon, egg and cheese biscuit and hash browns for breakfast.  We had a lengthy conversation about healthy diet and cardiovascular risk associated with obesity, high blood pressure, hyperlipidemia.  She does not have any specific cardiac concerns.  She denies chest pain, shortness of breath, palpitations, orthopnea, PND, edema, presyncope, syncope.   ROS: See HPI        Studies Reviewed: .        Risk Assessment/Calculations:             Physical Exam:   VS:  BP 124/80   Pulse (!) 104   Ht 5\' 5"  (1.651 m)   Wt (!) 327 lb 6.4 oz (148.5 kg)   SpO2 99%   BMI 54.48 kg/m    Wt Readings from Last 3 Encounters:  09/14/23 (!) 327 lb 6.4 oz (148.5 kg)  07/02/23 (!) 330 lb (149.7 kg)  05/01/23 (!) 324 lb (147 kg)    GEN: Obese, well developed in no acute distress NECK: No JVD; No carotid bruits CARDIAC: RRR, no murmurs, rubs, gallops RESPIRATORY:  Clear to auscultation without rales, wheezing or rhonchi  ABDOMEN: Soft, non-tender, non-distended EXTREMITIES:  No edema; No deformity     ASSESSMENT AND PLAN: .    Sinus tachycardia: HR is mildly elevated on exam. I feel this is at least partly secondary to deconditioning. No significant tachypalpitations. Encouraged increased physical activity and weight loss. Continue metoprolol.   Hyperlipidemia LDL goal < 70: LDL 65 on 09/11/23. Continue rosuvastatin.   Coronary calcification on CT: Minimal scattered coronary artery calcifications on CT 04/2023.  Lengthy discussion about reducing risk of future stroke and heart attack.  Secondary prevention emphasized including heart healthy diet avoiding processed foods, simple carbohydrates, and saturated fat along with  goal of 150 minutes of moderate intensity exercise each week.  Continue rosuvastatin.  Morbid obesity: BMI 54. Lengthy discussion about health risks associated with obesity and healthy diet. Offered suggestions for meal prepping and for healthy options when eating out. Aim for 30 minutes of walking for exercise daily.   Hypertension: BP initially elevated but improved on my recheck. Encouraged her to get a home BP monitor. Correct technique reviewed. Goal BP < 120/80. Encouraged her to notify us or PCP if BP consistently higher.        Dispo: 6 months with me  Signed, Eligha Bridegroom, NP-C

## 2023-09-14 ENCOUNTER — Ambulatory Visit: Payer: No Typology Code available for payment source | Attending: Nurse Practitioner | Admitting: Nurse Practitioner

## 2023-09-14 ENCOUNTER — Encounter: Payer: Self-pay | Admitting: Nurse Practitioner

## 2023-09-14 VITALS — BP 124/80 | HR 104 | Ht 65.0 in | Wt 327.4 lb

## 2023-09-14 DIAGNOSIS — R000 Tachycardia, unspecified: Secondary | ICD-10-CM

## 2023-09-14 DIAGNOSIS — I10 Essential (primary) hypertension: Secondary | ICD-10-CM

## 2023-09-14 DIAGNOSIS — Z6841 Body Mass Index (BMI) 40.0 and over, adult: Secondary | ICD-10-CM

## 2023-09-14 DIAGNOSIS — E785 Hyperlipidemia, unspecified: Secondary | ICD-10-CM | POA: Diagnosis not present

## 2023-09-14 DIAGNOSIS — I2510 Atherosclerotic heart disease of native coronary artery without angina pectoris: Secondary | ICD-10-CM

## 2023-09-14 NOTE — Patient Instructions (Signed)
Medication Instructions:   Your physician recommends that you continue on your current medications as directed. Please refer to the Current Medication list given to you today.   *If you need a refill on your cardiac medications before your next appointment, please call your pharmacy*   Lab Work:  None ordered.  If you have labs (blood work) drawn today and your tests are completely normal, you will receive your results only by: MyChart Message (if you have MyChart) OR A paper copy in the mail If you have any lab test that is abnormal or we need to change your treatment, we will call you to review the results.   Testing/Procedures:  None ordered.   Follow-Up: At [facility], you and your health needs are our priority.  As part of our continuing mission to provide you with exceptional heart care, we have created designated Provider Care Teams.  These Care Teams include your primary Cardiologist (physician) and Advanced Practice Providers (APPs -  Physician Assistants and Nurse Practitioners) who all work together to provide you with the care you need, when you need it.  We recommend signing up for the patient portal called "MyChart".  Sign up information is provided on this After Visit Summary.  MyChart is used to connect with patients for Virtual Visits (Telemedicine).  Patients are able to view lab/test results, encounter notes, upcoming appointments, etc.  Non-urgent messages can be sent to your provider as well.   To learn more about what you can do with MyChart, go to [email or web address].    Your next appointment:   6 month(s)  Provider:   Eligha Bridegroom, NP         Other Instructions   HOW TO TAKE YOUR BLOOD PRESSURE  Rest 5 minutes before taking your blood pressure. Don't  smoke or drink caffeinated beverages for at least 30 minutes before. Take your blood pressure before (not after) you eat. Sit comfortably with your back supported and both feet on the  floor ( don't cross your legs). Elevate your arm to heart level on a table or a desk. Use the proper sized cuff.  It should fit smoothly and snugly around your bare upper arm.  There should be  Enough room to slip a fingertip under the cuff.  The bottom edge of the cuff should be 1 inch above the crease Of the elbow. Please monitor your blood pressure once daily 2 hours after your am medication. If you blood pressure Consistently remains above 120 (systolic) top number or over 80 ( diastolic) bottom number X 3 days  Consecutively.  Please call our office at [phone] or send Mychart message.     ----Avoid cold medicines with D or DM at the end of them----

## 2023-10-02 ENCOUNTER — Other Ambulatory Visit: Payer: Self-pay

## 2023-10-02 ENCOUNTER — Encounter (HOSPITAL_COMMUNITY): Payer: Self-pay | Admitting: Obstetrics and Gynecology

## 2023-10-02 NOTE — Progress Notes (Signed)
PCP - Sigmund Hazel Cardiologist - Nahser, Deloris Ping, MD  PPM/ICD - denies Device Orders - n/a Rep Notified - n/a  Chest x-ray - 05-01-23 EKG - 05-04-23 Stress Test -  ECHO - 12-23-20 Cardiac Cath -   CPAP - denies  GLP-1 -tirzepatide [facility]) LAST DOSE 09-24-23  Fasting Blood Sugar - Per patient between 180-200 in the am. Checks Blood Sugar Dexcom Right upper arm changed today  Blood Thinner Instructions: denies Aspirin Instructions: n/a  ERAS Protcol - clear liquids until 6:55am.  COVID TEST- no  Anesthesia review: yes Hx HTN, DM, SVT  Patient verbally denies any shortness of breath, fever, cough and chest pain during phone call   -------------  SDW INSTRUCTIONS given:  Your procedure is scheduled on October 03, 2023.  Report to [facility] Main Entrance "A" at 7:25 A.M., and check in at the Admitting office.  Call this number if you have problems the morning of surgery:  [phone]   Remember:  Do not eat after midnight the night before your surgery  You may drink clear liquids until 6:55 the morning of your surgery.   Clear liquids allowed are: Water, Non-Citrus Juices (without pulp), Carbonated Beverages, Clear Tea, Black Coffee Only, and Gatorade    Take these medicines the morning of surgery with A SIP OF WATER  amLODipine (NORVASC)  megestrol (MEGACE)  metoprolol succinate (TOPROL XL)  rosuvastatin (CRESTOR)    IF needed acetaminophen (TYLENOL)  levocetirizine (XYZAL)  traMADol (ULTRAM)  cyclobenzaprine (FLEXERIL)   WHAT DO I DO ABOUT MY DIABETES MEDICATION?   Do not take oral diabetes medicines (pills) the morning of surgery.  FARXIGA   THE NIGHT BEFORE SURGERY, take_ units of insulin. Patient already taken supper dose no bedtime dose        THE MORNING OF SURGERY, take 20 units of insulin glargine, 1 Unit Dial, (TOUJEO SOLOSTAR insulin.  insulin aspart only take if blood sugar greater than 220 then only take half The day of surgery, do not  take other diabetes injectables, including Byetta (exenatide), Bydureon (exenatide ER), Victoza (liraglutide), or Trulicity (dulaglutide).  If your CBG is greater than 220 mg/dL, you may take  of your sliding scale (correction) dose of insulin.   HOW TO MANAGE YOUR DIABETES BEFORE AND AFTER SURGERY  Why is it important to control my blood sugar before and after surgery? Improving blood sugar levels before and after surgery helps healing and can limit problems. A way of improving blood sugar control is eating a healthy diet by:  Eating less sugar and carbohydrates  Increasing activity/exercise  Talking with your doctor about reaching your blood sugar goals High blood sugars (greater than 180 mg/dL) can raise your risk of infections and slow your recovery, so you will need to focus on controlling your diabetes during the weeks before surgery. Make sure that the doctor who takes care of your diabetes knows about your planned surgery including the date and location.  How do I manage my blood sugar before surgery? Check your blood sugar at least 4 times a day, starting 2 days before surgery, to make sure that the level is not too high or low.  Check your blood sugar the morning of your surgery when you wake up and every 2 hours until you get to the Short Stay unit.  If your blood sugar is less than 70 mg/dL, you will need to treat for low blood sugar: Do not take insulin. Treat a low blood sugar (less than 70  mg/dL) with  cup of clear juice (cranberry or apple), 4 glucose tablets, OR glucose gel. Recheck blood sugar in 15 minutes after treatment (to make sure it is greater than 70 mg/dL). If your blood sugar is not greater than 70 mg/dL on recheck, call [phone] for further instructions. Report your blood sugar to the short stay nurse when you get to Short Stay.  If you are admitted to the hospital after surgery: Your blood sugar will be checked by the staff and you will probably be  given insulin after surgery (instead of oral diabetes medicines) to make sure you have good blood sugar levels. The goal for blood sugar control after surgery is 80-180 mg/dL.   As of today, STOP taking any Aspirin (unless otherwise instructed by your surgeon) Aleve, Naproxen, Ibuprofen, Motrin, Advil, Goody's, BC's, all herbal medications, fish oil, and all vitamins. THIS INCLUDES YOUR meloxicam (MOBIC) ,                      Do not wear jewelry, make up, or nail polish            Do not wear lotions, powders, perfumes/colognes, or deodorant.            Do not shave 48 hours prior to surgery.  Men may shave face and neck.            Do not bring valuables to the hospital.            [facility] is not responsible for any belongings or valuables.  Do NOT Smoke (Tobacco/Vaping) 24 hours prior to your procedure If you use a CPAP at night, you may bring all equipment for your overnight stay.   Contacts, glasses, dentures or bridgework may not be worn into surgery.      For patients admitted to the hospital, discharge time will be determined by your treatment team.   Patients discharged the day of surgery will not be allowed to drive home, and someone needs to stay with them for 24 hours.    Special instructions:   [facility]- Preparing For Surgery  Before surgery, you can play an important role. Because skin is not sterile, your skin needs to be as free of germs as possible. You can reduce the number of germs on your skin by washing with CHG (chlorahexidine gluconate) Soap before surgery.  CHG is an antiseptic cleaner which kills germs and bonds with the skin to continue killing germs even after washing.    Oral Hygiene is also important to reduce your risk of infection.  Remember - BRUSH YOUR TEETH THE MORNING OF SURGERY WITH YOUR REGULAR TOOTHPASTE  Please do not use if you have an allergy to CHG or antibacterial soaps. If your skin becomes reddened/irritated stop using the CHG.  Do  not shave (including legs and underarms) for at least 48 hours prior to first CHG shower. It is OK to shave your face.  Please follow these instructions carefully.   Shower the NIGHT BEFORE SURGERY and the MORNING OF SURGERY with DIAL Soap.   Pat yourself dry with a CLEAN TOWEL.  Wear CLEAN PAJAMAS to bed the night before surgery  Place CLEAN SHEETS on your bed the night of your first shower and DO NOT SLEEP WITH PETS.   Day of Surgery: Please shower morning of surgery  Wear Clean/Comfortable clothing the morning of surgery Do not apply any deodorants/lotions.   Remember to brush your teeth WITH YOUR  REGULAR TOOTHPASTE.   Questions were answered. Patient verbalized understanding of instructions.

## 2023-10-02 NOTE — Anesthesia Preprocedure Evaluation (Addendum)
Anesthesia Evaluation  Patient identified by MRN, date of birth, ID band Patient awake    Reviewed: Allergy & Precautions, NPO status , Patient's Chart, lab work & pertinent test results  History of Anesthesia Complications Negative for: history of anesthetic complications  Airway Mallampati: III  TM Distance: >3 FB Neck ROM: Full    Dental  (+) Edentulous Upper, Dental Advisory Given   Pulmonary former smoker   Pulmonary exam normal breath sounds clear to auscultation       Cardiovascular hypertension (146/69 preop), Pt. on medications and Pt. on home beta blockers Normal cardiovascular exam Rhythm:Regular Rate:Normal     Neuro/Psych negative neurological ROS     GI/Hepatic negative GI ROS, Neg liver ROS,,,  Endo/Other  diabetes, Type 2, Insulin Dependent, Oral Hypoglycemic Agents  Morbid obesity (BMI 55)20 units short acting and 20 units long acting this morning  Renal/GU negative Renal ROS  negative genitourinary   Musculoskeletal  (+) Arthritis ,    Abdominal  (+) + obese  Peds  Hematology negative hematology ROS (+)   Anesthesia Other Findings Mounjaro LD >7d  Reproductive/Obstetrics AUB                             Anesthesia Physical Anesthesia Plan  ASA: 3  Anesthesia Plan: General   Post-op Pain Management: Tylenol PO (pre-op)* and Toradol IV (intra-op)*   Induction: Intravenous  PONV Risk Score and Plan: 3 and Midazolam, Treatment may vary due to age or medical condition, Dexamethasone and Ondansetron  Airway Management Planned: LMA and Oral ETT  Additional Equipment: None  Intra-op Plan:   Post-operative Plan: Extubation in OR  Informed Consent: I have reviewed the patients History and Physical, chart, labs and discussed the procedure including the risks, benefits and alternatives for the proposed anesthesia with the patient or authorized representative who has  indicated his/her understanding and acceptance.     Dental advisory given  Plan Discussed with: CRNA  Anesthesia Plan Comments:         Anesthesia Quick Evaluation

## 2023-10-03 ENCOUNTER — Other Ambulatory Visit: Payer: Self-pay

## 2023-10-03 ENCOUNTER — Ambulatory Visit (HOSPITAL_COMMUNITY)
Admission: RE | Admit: 2023-10-03 | Discharge: 2023-10-03 | Disposition: A | Discharge status: Home / Self Care | Payer: No Typology Code available for payment source | Attending: Obstetrics and Gynecology | Admitting: Obstetrics and Gynecology

## 2023-10-03 ENCOUNTER — Ambulatory Visit (HOSPITAL_COMMUNITY): Payer: No Typology Code available for payment source | Admitting: Anesthesiology

## 2023-10-03 ENCOUNTER — Ambulatory Visit (HOSPITAL_BASED_OUTPATIENT_CLINIC_OR_DEPARTMENT_OTHER): Payer: Self-pay | Admitting: Anesthesiology

## 2023-10-03 ENCOUNTER — Encounter (HOSPITAL_COMMUNITY)
Admission: RE | Disposition: A | Discharge status: Home / Self Care | Payer: Self-pay | Source: Home / Self Care | Attending: Obstetrics and Gynecology

## 2023-10-03 ENCOUNTER — Encounter (HOSPITAL_COMMUNITY): Payer: Self-pay | Admitting: Obstetrics and Gynecology

## 2023-10-03 DIAGNOSIS — I10 Essential (primary) hypertension: Secondary | ICD-10-CM | POA: Diagnosis not present

## 2023-10-03 DIAGNOSIS — Z87891 Personal history of nicotine dependence: Secondary | ICD-10-CM | POA: Insufficient documentation

## 2023-10-03 DIAGNOSIS — N840 Polyp of corpus uteri: Secondary | ICD-10-CM | POA: Insufficient documentation

## 2023-10-03 DIAGNOSIS — E119 Type 2 diabetes mellitus without complications: Secondary | ICD-10-CM | POA: Insufficient documentation

## 2023-10-03 DIAGNOSIS — N939 Abnormal uterine and vaginal bleeding, unspecified: Secondary | ICD-10-CM | POA: Diagnosis present

## 2023-10-03 HISTORY — DX: Cardiac arrhythmia, unspecified: I49.9

## 2023-10-03 HISTORY — DX: Unspecified osteoarthritis, unspecified site: M19.90

## 2023-10-03 HISTORY — PX: DILATATION & CURETTAGE/HYSTEROSCOPY WITH MYOSURE: SHX6511

## 2023-10-03 LAB — TYPE AND SCREEN
ABO/RH(D): A POS
Antibody Screen: NEGATIVE

## 2023-10-03 LAB — CBC
HCT: 37.9 % (ref 36.0–46.0)
Hemoglobin: 12.1 g/dL (ref 12.0–15.0)
MCH: 25.9 pg — ABNORMAL LOW (ref 26.0–34.0)
MCHC: 31.9 g/dL (ref 30.0–36.0)
MCV: 81.2 fL (ref 80.0–100.0)
Platelets: 364 10*3/uL (ref 150–400)
RBC: 4.67 MIL/uL (ref 3.87–5.11)
RDW: 17.6 % — ABNORMAL HIGH (ref 11.5–15.5)
WBC: 8.1 10*3/uL (ref 4.0–10.5)
nRBC: 0 % (ref 0.0–0.2)

## 2023-10-03 LAB — BASIC METABOLIC PANEL
Anion gap: 13 (ref 5–15)
BUN: 17 mg/dL (ref 8–23)
CO2: 19 mmol/L — ABNORMAL LOW (ref 22–32)
Calcium: 9.5 mg/dL (ref 8.9–10.3)
Chloride: 104 mmol/L (ref 98–111)
Creatinine, Ser: 0.98 mg/dL (ref 0.44–1.00)
GFR, Estimated: >60 mL/min (ref >60)
Glucose, Bld: 160 mg/dL — ABNORMAL HIGH (ref 70–99)
Potassium: 3.8 mmol/L (ref 3.5–5.1)
Sodium: 136 mmol/L (ref 135–145)

## 2023-10-03 LAB — GLUCOSE, CAPILLARY
Glucose-Capillary: 183 mg/dL — ABNORMAL HIGH (ref 70–99)
Glucose-Capillary: 126 mg/dL — ABNORMAL HIGH (ref 70–99)

## 2023-10-03 LAB — POCT PREGNANCY, URINE: Preg Test, Ur: NEGATIVE

## 2023-10-03 LAB — ABO/RH: ABO/RH(D): A POS

## 2023-10-03 SURGERY — DILATATION & CURETTAGE/HYSTEROSCOPY WITH MYOSURE
Anesthesia: General | Site: Uterus

## 2023-10-03 MED ORDER — FENTANYL CITRATE (PF) 250 MCG/5ML IJ SOLN
INTRAMUSCULAR | Status: DC | PRN
  Administered 2023-10-03 (×2): 50 ug via INTRAVENOUS

## 2023-10-03 MED ORDER — PHENYLEPHRINE 80 MCG/ML (10ML) SYRINGE FOR IV PUSH (FOR BLOOD PRESSURE SUPPORT)
PREFILLED_SYRINGE | INTRAVENOUS | Status: DC | PRN
  Administered 2023-10-03: 160 ug via INTRAVENOUS
  Administered 2023-10-03 (×2): 80 ug via INTRAVENOUS

## 2023-10-03 MED ORDER — INSULIN ASPART 100 UNIT/ML IJ SOLN: 0.0000 [IU] | INTRAMUSCULAR | Status: DC | PRN

## 2023-10-03 MED ORDER — DROPERIDOL 2.5 MG/ML IJ SOLN: 0.6250 mg | Freq: Once | INTRAMUSCULAR | Status: DC | PRN

## 2023-10-03 MED ORDER — MIDAZOLAM HCL 2 MG/2ML IJ SOLN
INTRAMUSCULAR | Status: DC | PRN
  Administered 2023-10-03: 2 mg via INTRAVENOUS

## 2023-10-03 MED ORDER — SODIUM CHLORIDE 0.9 % IR SOLN
Status: DC | PRN
  Administered 2023-10-03: 3000 mL

## 2023-10-03 MED ORDER — ACETAMINOPHEN 500 MG PO TABS
1000.0000 mg | ORAL_TABLET | Freq: Once | ORAL | Status: AC
  Administered 2023-10-03: 1000 mg via ORAL
  Filled 2023-10-03: qty 2

## 2023-10-03 MED ORDER — LIDOCAINE 2% (20 MG/ML) 5 ML SYRINGE
INTRAMUSCULAR | Status: DC | PRN
  Administered 2023-10-03: 60 mg via INTRAVENOUS

## 2023-10-03 MED ORDER — ONDANSETRON HCL 4 MG/2ML IJ SOLN
INTRAMUSCULAR | Status: AC
  Filled 2023-10-03: qty 2

## 2023-10-03 MED ORDER — MIDAZOLAM HCL 2 MG/2ML IJ SOLN
INTRAMUSCULAR | Status: AC
  Filled 2023-10-03: qty 2

## 2023-10-03 MED ORDER — OXYCODONE HCL 5 MG PO TABS: 5.0000 mg | ORAL_TABLET | Freq: Once | ORAL | Status: DC | PRN

## 2023-10-03 MED ORDER — PROPOFOL 10 MG/ML IV BOLUS
INTRAVENOUS | Status: AC
  Filled 2023-10-03: qty 20

## 2023-10-03 MED ORDER — FENTANYL CITRATE (PF) 100 MCG/2ML IJ SOLN: 25.0000 ug | INTRAMUSCULAR | Status: DC | PRN

## 2023-10-03 MED ORDER — ORAL CARE MOUTH RINSE
15.0000 mL | Freq: Once | OROMUCOSAL | Status: AC
Start: 2023-10-03 — End: 2023-10-03

## 2023-10-03 MED ORDER — OXYCODONE HCL 5 MG/5ML PO SOLN: 5.0000 mg | Freq: Once | ORAL | Status: DC | PRN

## 2023-10-03 MED ORDER — PROPOFOL 10 MG/ML IV BOLUS
INTRAVENOUS | Status: DC | PRN
  Administered 2023-10-03: 200 mg via INTRAVENOUS

## 2023-10-03 MED ORDER — LIDOCAINE 2% (20 MG/ML) 5 ML SYRINGE
INTRAMUSCULAR | Status: AC
  Filled 2023-10-03: qty 5

## 2023-10-03 MED ORDER — IBUPROFEN 800 MG PO TABS: 800.0000 mg | ORAL_TABLET | Freq: Three times a day (TID) | ORAL | 0 refills | Status: AC | PRN

## 2023-10-03 MED ORDER — CHLORHEXIDINE GLUCONATE 0.12 % MT SOLN
15.0000 mL | Freq: Once | OROMUCOSAL | Status: AC
  Administered 2023-10-03: 15 mL via OROMUCOSAL
  Filled 2023-10-03: qty 15

## 2023-10-03 MED ORDER — LACTATED RINGERS IV SOLN: INTRAVENOUS | Status: DC

## 2023-10-03 MED ORDER — ONDANSETRON HCL 4 MG/2ML IJ SOLN
INTRAMUSCULAR | Status: DC | PRN
  Administered 2023-10-03: 4 mg via INTRAVENOUS

## 2023-10-03 MED ORDER — FENTANYL CITRATE (PF) 250 MCG/5ML IJ SOLN
INTRAMUSCULAR | Status: AC
  Filled 2023-10-03: qty 5

## 2023-10-03 SURGICAL SUPPLY — 17 items
CATH ROBINSON RED A/P 16FR (CATHETERS) IMPLANT
CNTNR URN SCR LID CUP LEK RST (MISCELLANEOUS) ×1 IMPLANT
CONT SPEC 4OZ STRL OR WHT (MISCELLANEOUS) ×1
DEVICE MYOSURE LITE (MISCELLANEOUS) IMPLANT
DEVICE MYOSURE REACH (MISCELLANEOUS) IMPLANT
DILATOR CANAL MILEX (MISCELLANEOUS) IMPLANT
GLOVE BIO SURGEON STRL SZ 6.5 (GLOVE) ×1 IMPLANT
GLOVE BIOGEL PI IND STRL 7.0 (GLOVE) ×2 IMPLANT
GOWN STRL REUS W/ TWL LRG LVL3 (GOWN DISPOSABLE) ×2 IMPLANT
GOWN STRL REUS W/TWL LRG LVL3 (GOWN DISPOSABLE) ×2
KIT PROCEDURE FLUENT (KITS) ×1 IMPLANT
KIT TURNOVER KIT B (KITS) ×1 IMPLANT
PACK VAGINAL MINOR WOMEN LF (CUSTOM PROCEDURE TRAY) ×1 IMPLANT
PAD OB MATERNITY 4.3X12.25 (PERSONAL CARE ITEMS) ×1 IMPLANT
SEAL ROD LENS SCOPE MYOSURE (ABLATOR) ×1 IMPLANT
TOWEL GREEN STERILE FF (TOWEL DISPOSABLE) ×1 IMPLANT
UNDERPAD 30X36 HEAVY ABSORB (UNDERPADS AND DIAPERS) ×1 IMPLANT

## 2023-10-03 NOTE — Anesthesia Postprocedure Evaluation (Signed)
Anesthesia Post Note  Patient: Jatasia Zakowski  Procedure(s) Performed: DILATATION & CURETTAGE/HYSTEROSCOPY WITH MYOSURE POLYPECTOMY (Uterus)     Patient location during evaluation: PACU Anesthesia Type: General Level of consciousness: awake and alert Pain management: pain level controlled Vital Signs Assessment: post-procedure vital signs reviewed and stable Respiratory status: spontaneous breathing, nonlabored ventilation and respiratory function stable Cardiovascular status: blood pressure returned to baseline Postop Assessment: no apparent nausea or vomiting Anesthetic complications: no Comments: Sinus tachycardia consistent with preop status. Patient asymptomatic. Stephannie Peters, MD   There were no known notable events for this encounter.  Last Vitals:  Vitals:   10/03/23 1215 10/03/23 1230  BP: (!) 141/80 137/89  Pulse: (!) 110 (!) 115  Resp: 16 13  Temp:  36.9 C  SpO2: 96% 96%    Last Pain:  Vitals:   10/03/23 1100  TempSrc:   PainSc: 0-No pain                 Shanda Howells

## 2023-10-03 NOTE — Transfer of Care (Signed)
Immediate Anesthesia Transfer of Care Note  Patient: Destiny Alexander  Procedure(s) Performed: DILATATION & CURETTAGE/HYSTEROSCOPY WITH MYOSURE POLYPECTOMY (Uterus)  Patient Location: PACU  Anesthesia Type:General  Level of Consciousness: awake, alert , and oriented  Airway & Oxygen Therapy: Patient Spontanous Breathing  Post-op Assessment: Report given to RN and Post -op Vital signs reviewed and stable  Post vital signs: Reviewed and stable  Last Vitals:  Vitals Value Taken Time  BP 168/86 10/03/23 1100  Temp    Pulse 121 10/03/23 1100  Resp    SpO2 96 % 10/03/23 1100  Vitals shown include unfiled device data.  Last Pain:  Vitals:   10/03/23 0749  TempSrc:   PainSc: 5       Patients Stated Pain Goal: 0 (10/03/23 0749)  Complications: There were no known notable events for this encounter.

## 2023-10-03 NOTE — Interval H&P Note (Signed)
History and Physical Interval Note:  10/03/2023 9:46 AM  Destiny Alexander  has presented today for surgery, with the diagnosis of Abnormal uterine bleeding.  The various methods of treatment have been discussed with the patient and family. After consideration of risks, benefits and other options for treatment, the patient has consented to  Procedure(s): DILATATION & CURETTAGE/HYSTEROSCOPY WITH MYOSURE (N/A) as a surgical intervention.  The patient's history has been reviewed, patient examined, no change in status, stable for surgery.  I have reviewed the patient's chart and labs.  Questions were answered to the patient's satisfaction.     Steva Ready

## 2023-10-03 NOTE — Op Note (Signed)
Pre Op Dx:   1. Abnormal uterine bleeding 2. Uterine fibroids  Post Op Dx:   1. Abnormal uterine bleeding 2. Uterine fibroids 3. Endometrial polyp  Procedure:   Hysteroscopy with Dilation and Curettage with Myosure polypectomy   Surgeon:  Dr. Steva Ready Assistants:  None Anesthesia:  LMA   EBL:  5cc  IVF:  300cc UOP:  Voided prior to arrival to OR Fluid Deficit:  145cc   Drains:  None Specimen removed:  Endometrial curettings and endometrial polyp - sent to pathology Device(s) implanted: None Case Type:  Clean-contaminated Findings:  Normal-appearing ectocervix and endocervical canal. Endometrium appeared normal. A flat broad-based polyp noted at the left cornua near the left tubal ostia. Bilateral tubal ostia visualized. Complications: None Indications:  62 y.o. G4P2 perimenopausal female with AUB despite medical management.  Description of each procedure:  After informed consent was obtained the patient was taken to the operating room in the dorsal supine position.  After administration of general anesthesia, the patient was placed in the dorsal lithotomy position and prepped and draped in the usual sterile fashion. A pre-operative time-out was completed.  The anterior lip of the cervix was grasped with a single-tooth tenaculum and the cervix was serially dilated to accommodate the hysteroscope.  The hysteroscope was advanced and the findings as above was noted. The Myosure Reach was used to resect the endometrial polyp and sample the endometrium. A sharp banjo curette was used to curettage the endometrium. The single-tooth tenaculum was removed and its sites were made hemostatic with silver nitrate and pressure.  Adequate hemostasis was noted.  The patient was awakened and extubated and appeared to have tolerated the procedure well.  All counts were correct.  Disposition:  PACU  Steva Ready, DO

## 2023-10-03 NOTE — Anesthesia Procedure Notes (Signed)
Procedure Name: LMA Insertion Date/Time: 10/03/2023 10:26 AM  Performed by: Sharyn Dross, CRNAPre-anesthesia Checklist: Patient identified, Emergency Drugs available, Suction available and Patient being monitored Patient Re-evaluated:Patient Re-evaluated prior to induction Oxygen Delivery Method: Circle system utilized Preoxygenation: Pre-oxygenation with 100% oxygen Induction Type: IV induction Ventilation: Mask ventilation without difficulty LMA: LMA inserted LMA Size: 4.0 Number of attempts: 1 Placement Confirmation: positive ETCO2 and breath sounds checked- equal and bilateral Tube secured with: Tape Dental Injury: Teeth and Oropharynx as per pre-operative assessment

## 2023-10-04 ENCOUNTER — Encounter (HOSPITAL_COMMUNITY): Payer: Self-pay | Admitting: Obstetrics and Gynecology

## 2023-10-04 LAB — SURGICAL PATHOLOGY

## 2023-10-23 ENCOUNTER — Encounter: Payer: Self-pay | Admitting: Emergency Medicine

## 2023-10-23 ENCOUNTER — Other Ambulatory Visit: Payer: Self-pay

## 2023-10-23 ENCOUNTER — Ambulatory Visit
Admission: EM | Admit: 2023-10-23 | Discharge: 2023-10-23 | Disposition: A | Discharge status: Home / Self Care | Payer: No Typology Code available for payment source | Attending: Internal Medicine | Admitting: Internal Medicine

## 2023-10-23 ENCOUNTER — Ambulatory Visit: Payer: No Typology Code available for payment source

## 2023-10-23 DIAGNOSIS — M25532 Pain in left wrist: Secondary | ICD-10-CM

## 2023-10-23 DIAGNOSIS — M79642 Pain in left hand: Secondary | ICD-10-CM

## 2023-10-23 MED ORDER — PREDNISONE 20 MG PO TABS: 20.0000 mg | ORAL_TABLET | Freq: Every day | ORAL | 0 refills | Status: AC

## 2023-10-23 NOTE — ED Triage Notes (Signed)
Pt here for left hand pain and swelling going up left arm x 4 days; pt denies obvious injury

## 2023-10-23 NOTE — Discharge Instructions (Signed)
Brace has been applied.  Prednisone prescribed.  Monitor your blood sugar very diligently while taking prednisone.  Follow-up with orthopedist for further evaluation and management.

## 2023-10-23 NOTE — ED Provider Notes (Signed)
[facility]    CSN: [id number] Arrival date & time: 10/23/23  0802      History   Chief Complaint Chief Complaint  Patient presents with   Hand Pain    HPI Destiny Alexander is a 62 y.o. female.   Patient presents with left hand and wrist pain that started about 4 days ago.  Reports this is a chronic and recurrent issue for her but she has never been evaluated for it.  Denies any previous injuries to the area.  Reports that she can typically take ibuprofen and it resolves on its own.  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  She has taken ibuprofen and Flexeril with no improvement.  Reports that she has also had a little bit of numbness and tingling.  Majority of pain is present in the left thumb and radiates to the wrist.   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) 01/08/2017    Past Surgical History:  Procedure Laterality Date   APPENDECTOMY     CESAREAN SECTION     x 2   CHOLECYSTECTOMY     COLONOSCOPY WITH PROPOFOL N/A 10/25/2022   Procedure: COLONOSCOPY WITH PROPOFOL;  Surgeon: Willis Modena, MD;  Location: [facility];  Service: Gastroenterology;  Laterality: N/A;   DILATATION & CURETTAGE/HYSTEROSCOPY WITH MYOSURE N/A 10/03/2023   Procedure: DILATATION & CURETTAGE/HYSTEROSCOPY WITH MYOSURE POLYPECTOMY;  Surgeon: Steva Ready, DO;   Location: [facility] OR;  Service: Gynecology;  Laterality: N/A;   LIVER SURGERY     "piece of liver removed w/choley"   TUBAL LIGATION      OB History   No obstetric history on file.      Home Medications    Prior to Admission medications   Medication Sig Start Date End Date Taking? Authorizing Provider  predniSONE (DELTASONE) 20 MG tablet Take 1 tablet (20 mg total) by mouth daily for 5 days. 10/23/23 10/28/23 Yes Yesenia Locurto, Acie Fredrickson, FNP  acetaminophen (TYLENOL) 650 MG CR tablet Take 650 mg by mouth 2 (two) times daily as needed for pain.    [provider]  amLODipine (NORVASC) 5 MG tablet Take 5 mg by mouth daily.    [provider]  cholecalciferol (VITAMIN D3) 25 MCG (1000 UNIT) tablet Take 4,000 Units by mouth every other day. Alternates days with multivitamin    [provider]  Continuous Blood Gluc Sensor (FREESTYLE LIBRE 2 SENSOR) MISC  12/14/20   [provider]  cyclobenzaprine (FLEXERIL) 10 MG tablet Take 10 mg by mouth 2 (two) times daily as needed for muscle spasms. 04/19/23   [provider]  FARXIGA 5 MG TABS tablet Take 5 mg by mouth daily. 01/23/23   [provider]  fluticasone (FLONASE) 50 MCG/ACT nasal spray Place 1 spray into both nostrils daily for 3 days. Patient taking differently: Place 1  spray into both nostrils daily as needed for allergies. 12/14/21 10/03/23  Gustavus Bryant, FNP  hydrochlorothiazide (HYDRODIURIL) 25 MG tablet Take 25 mg by mouth daily.    [provider]  ibuprofen (ADVIL) 800 MG tablet Take 1 tablet (800 mg total) by mouth every 8 (eight) hours as needed for moderate pain (pain score 4-6) or cramping. DO NOT TAKE WITH MELOXICAM. 10/03/23   Steva Ready, DO  insulin aspart (NOVOLOG FLEXPEN) 100 UNIT/ML FlexPen Inject 20-40 Units into the skin See admin instructions. Inject 40 units with breakfast, 40 units with lunch, and 20-40 units at dinner    [provider]  insulin glargine, 1  Unit Dial, (TOUJEO SOLOSTAR) 300 UNIT/ML Solostar Pen Inject 10-40 Units into the skin See admin instructions. Inject 40 units in the morning, may inject 10 units at night as needed for high blood sugar    [provider]  levocetirizine (XYZAL) 5 MG tablet Take 5 mg by mouth daily as needed for allergies.    [provider]  losartan (COZAAR) 100 MG tablet Take 100 mg by mouth daily.    [provider]  megestrol (MEGACE) 40 MG tablet Take 40 mg by mouth daily. 11/18/20   [provider]  meloxicam (MOBIC) 7.5 MG tablet Take 1 tablet (7.5 mg total) by mouth daily. Patient taking differently: Take 7.5 mg by mouth daily as needed for pain. 04/16/23   Antony Madura, PA-C  metoprolol succinate (TOPROL XL) 25 MG 24 hr tablet Take 1 tablet (25 mg total) by mouth daily. 07/02/23   Nahser, Deloris Ping, MD  Multiple Vitamin (MULTI-VITAMIN) tablet Take 1 tablet by mouth every other day. Alternate with vitamin d    [provider]  rosuvastatin (CRESTOR) 10 MG tablet Take 10 mg by mouth daily.    [provider]  tirzepatide Greggory Keen) 5 MG/0.5ML Pen Inject 5 mg into the skin every Tuesday.    [provider]  traMADol (ULTRAM) 50 MG tablet Take 50 mg by mouth every 8 (eight) hours as needed for moderate pain. 07/24/23   [provider]    Family History Family History  Problem Relation Age of Onset   CVA Mother    Diabetes Mother    Hypertension Mother    Arthritis/Rheumatoid Mother    Heart failure Father    Diabetes Father    Hypertension Father    Diabetes Brother    Hypertension Sister     Social History Social History   Tobacco Use   Smoking status: Former    Types: Cigarettes    Start date: 11/24/2016   Smokeless tobacco: Never   Tobacco comments:    quit 1985  Substance Use Topics   Alcohol use: No    Comment: quit 1985   Drug use: No     Allergies   Pioglitazone, Ace inhibitors, Canagliflozin, Amoxicillin,  Metformin and related, Nabumetone, Penicillins, and Semaglutide   Review of Systems Review of Systems Per HPI  Physical Exam Triage Vital Signs ED Triage Vitals [10/23/23 0837]  Encounter Vitals Group     BP (!) 151/84     Systolic BP Percentile      Diastolic BP Percentile      Pulse Rate 99     Resp 18     Temp 98.1 F (36.7 C)     Temp Source Oral     SpO2 98 %     Weight      Height  Head Circumference      Peak Flow      Pain Score 8     Pain Loc      Pain Education      Exclude from Growth Chart    No data found.  Updated Vital Signs BP (!) 151/84 (BP Location: Left Arm)   Pulse 99   Temp 98.1 F (36.7 C) (Oral)   Resp 18   LMP 05/12/2019 (Approximate) Comment: Has occasional spotting when she stops taking Me  SpO2 98%   Visual Acuity Right Eye Distance:   Left Eye Distance:   Bilateral Distance:    Right Eye Near:   Left Eye Near:    Bilateral Near:     Physical Exam Constitutional:      General: She is not in acute distress.    Appearance: Normal appearance. She is not toxic-appearing or diaphoretic.  HENT:     Head: Normocephalic and atraumatic.  Eyes:     Extraocular Movements: Extraocular movements intact.     Conjunctiva/sclera: Conjunctivae normal.  Pulmonary:     Effort: Pulmonary effort is normal.  Musculoskeletal:     Comments: Patient has tenderness to palpation throughout left thumb that extends circumferential to the left wrist.  Very mild swelling with no discoloration noted.  No significant tenderness to palpation to remainder of hand or fingers.  Range of motion elicits pain but patient has full range of motion of fingers.  Grip strength 5/5.  Neurovascularly intact.  No abrasions or lacerations noted.  Neurological:     General: No focal deficit present.     Mental Status: She is alert and oriented to person, place, and time. Mental status is at baseline.  Psychiatric:        Mood and Affect: Mood normal.        Behavior:  Behavior normal.        Thought Content: Thought content normal.        Judgment: Judgment normal.      UC Treatments / Results  Labs (all labs ordered are listed, but only abnormal results are displayed) Labs Reviewed - No data to display  EKG   Radiology DG Hand Complete Left  Result Date: 10/23/2023 CLINICAL DATA:  Acute left hand pain for 4 days without known injury. EXAM: LEFT HAND - COMPLETE 3+ VIEW COMPARISON:  None Available. FINDINGS: There is no evidence of fracture or dislocation. There is no evidence of arthropathy or other focal bone abnormality. Soft tissues are unremarkable. IMPRESSION: Negative. Electronically Signed   By: Lupita Raider M.D.   On: 10/23/2023 13:26   DG Wrist Complete Left  Result Date: 10/23/2023 CLINICAL DATA:  Acute left wrist pain for 4 days without known injury. EXAM: LEFT WRIST - COMPLETE 3+ VIEW COMPARISON:  None Available. FINDINGS: There is no evidence of fracture or dislocation. There is no evidence of arthropathy or other focal bone abnormality. Soft tissues are unremarkable. IMPRESSION: Negative. Electronically Signed   By: Lupita Raider M.D.   On: 10/23/2023 13:24    Procedures Procedures (including critical care time)  Medications Ordered in UC Medications - No data to display  Initial Impression / Assessment and Plan / UC Course  I have reviewed the triage vital signs and the nursing notes.  Pertinent labs & imaging results that were available during my care of the patient were reviewed by me and considered in my medical decision making (see chart for details).     Differential diagnoses  include de Quervain's tenosynovitis versus carpal tunnel syndrome versus osteoarthritis.  X-ray negative for any acute bony abnormality.  Patient placed in a thumb spica splint prior to discharge by clinical staff.  Advised patient of supportive care including elevation and ice application.  Patient reports ibuprofen has not been helpful so  will treat with prednisone steroid burst at a very low dose.  Patient does have diabetes so discussed risks associated with increased blood sugar with prednisone.  Advised patient to monitor blood glucose diligently at home and to stop taking if blood glucose  becomes elevated.  Advised strict follow-up with PCP if pain persists or worsens.  Patient verbalized understanding and was agreeable with plan. Final Clinical Impressions(s) / UC Diagnoses   Final diagnoses:  None     Discharge Instructions      Brace has been applied.  Prednisone prescribed.  Monitor your blood sugar very diligently while taking prednisone.  Follow-up with orthopedist for further evaluation and management.    ED Prescriptions     Medication Sig Dispense Auth. Provider   predniSONE (DELTASONE) 20 MG tablet Take 1 tablet (20 mg total) by mouth daily for 5 days. 5 tablet [place], Acie Fredrickson, [place]      PDMP not reviewed this encounter.   Gustavus Bryant, [place] 10/23/23 1409

## 2023-11-25 ENCOUNTER — Other Ambulatory Visit: Payer: Self-pay

## 2023-11-25 ENCOUNTER — Encounter (HOSPITAL_BASED_OUTPATIENT_CLINIC_OR_DEPARTMENT_OTHER): Payer: Self-pay | Admitting: Emergency Medicine

## 2023-11-25 ENCOUNTER — Emergency Department (HOSPITAL_BASED_OUTPATIENT_CLINIC_OR_DEPARTMENT_OTHER)
Admission: EM | Admit: 2023-11-25 | Discharge: 2023-11-25 | Disposition: A | Discharge status: Home / Self Care | Payer: No Typology Code available for payment source | Attending: Emergency Medicine | Admitting: Emergency Medicine

## 2023-11-25 DIAGNOSIS — M545 Low back pain, unspecified: Secondary | ICD-10-CM | POA: Diagnosis present

## 2023-11-25 DIAGNOSIS — Z79899 Other long term (current) drug therapy: Secondary | ICD-10-CM | POA: Insufficient documentation

## 2023-11-25 DIAGNOSIS — E119 Type 2 diabetes mellitus without complications: Secondary | ICD-10-CM | POA: Insufficient documentation

## 2023-11-25 DIAGNOSIS — Z794 Long term (current) use of insulin: Secondary | ICD-10-CM | POA: Diagnosis not present

## 2023-11-25 DIAGNOSIS — M62830 Muscle spasm of back: Secondary | ICD-10-CM | POA: Insufficient documentation

## 2023-11-25 DIAGNOSIS — I10 Essential (primary) hypertension: Secondary | ICD-10-CM | POA: Diagnosis not present

## 2023-11-25 DIAGNOSIS — M62838 Other muscle spasm: Secondary | ICD-10-CM

## 2023-11-25 MED ORDER — LIDOCAINE 5 % EX PTCH
1.0000 | MEDICATED_PATCH | CUTANEOUS | Status: DC
  Administered 2023-11-25: 1 via TRANSDERMAL
  Filled 2023-11-25: qty 1

## 2023-11-25 MED ORDER — LIDOCAINE 4 % EX PTCH: 1.0000 | MEDICATED_PATCH | CUTANEOUS | 0 refills | Status: AC

## 2023-11-25 MED ORDER — KETOROLAC TROMETHAMINE 15 MG/ML IJ SOLN
15.0000 mg | Freq: Once | INTRAMUSCULAR | Status: AC
  Administered 2023-11-25: 15 mg via INTRAMUSCULAR
  Filled 2023-11-25: qty 1

## 2023-11-25 MED ORDER — METHOCARBAMOL 500 MG PO TABS: 500.0000 mg | ORAL_TABLET | Freq: Two times a day (BID) | ORAL | 0 refills | Status: AC

## 2023-11-25 MED ORDER — METHOCARBAMOL 500 MG PO TABS
500.0000 mg | ORAL_TABLET | Freq: Once | ORAL | Status: AC
  Administered 2023-11-25: 500 mg via ORAL
  Filled 2023-11-25: qty 1

## 2023-11-25 MED ORDER — NAPROXEN 375 MG PO TABS: 375.0000 mg | ORAL_TABLET | Freq: Two times a day (BID) | ORAL | 0 refills | Status: AC

## 2023-11-25 NOTE — ED Triage Notes (Signed)
Pt POV from home ambulatory to triage reporting chronic pain in lower back on R side that has worsened past few days. Took prescribed muscle relaxers w no improvement.

## 2023-11-25 NOTE — ED Provider Notes (Signed)
[place] EMERGENCY DEPARTMENT AT [facility] Provider Note   CSN: [id number] Arrival date & time: 11/25/23  1407     History  Chief Complaint  Patient presents with   Back Pain    Destiny Alexander is a 62 y.o. female with a history of hypertension, diabetes mellitus, and arthritis who presents the ED today for back pain.  Patient reports right-sided lower back pain for the past several days.  The pain feels like a squeezing sensation of the right flank.  The pains occur intermittently and are worse with movement.  Patient also endorses pains down her right thigh that ends at her knee.  She denies any notable injury or trauma to the back.  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  She has taken Meloxicam and Flexeril without much improvement of her symptoms.  She denies any numbness, weakness, or inability to walk.  No saddle anesthesia.  Denies dysuria, hematuria, increased frequency, or urgency.  No additional complaints or concerns at this time.    Home Medications Prior to Admission medications   Medication Sig Start Date End Date Taking? Authorizing Provider  lidocaine (HM LIDOCAINE PATCH) 4 % Place 1 patch onto the skin daily for 14 days. 11/25/23 12/09/23 Yes Maxwell Marion, PA-C  methocarbamol (ROBAXIN) 500 MG tablet Take 1 tablet (500 mg total) by mouth 2 (two) times daily for 14 days. 11/25/23 12/09/23 Yes Maxwell Marion, PA-C  naproxen (NAPROSYN) 375 MG tablet Take 1 tablet (375 mg total) by mouth 2 (two) times daily for 14 days. 11/25/23 12/09/23 Yes Maxwell Marion, PA-C  acetaminophen (TYLENOL) 650 MG CR tablet Take 650 mg by mouth 2 (two) times daily as needed for pain.    [provider]  amLODipine (NORVASC) 5 MG tablet Take 5 mg by mouth daily.    [provider]  cholecalciferol (VITAMIN D3) 25 MCG (1000 UNIT) tablet Take 4,000 Units by mouth every other day. Alternates days with  multivitamin    [provider]  Continuous Blood Gluc Sensor (FREESTYLE LIBRE 2 SENSOR) MISC  12/14/20   [provider]  cyclobenzaprine (FLEXERIL) 10 MG tablet Take 10 mg by mouth 2 (two) times daily as needed for muscle spasms. 04/19/23   [provider]  FARXIGA 5 MG TABS tablet Take 5 mg by mouth daily. 01/23/23   [provider]  fluticasone (FLONASE) 50 MCG/ACT nasal spray Place 1 spray into both nostrils daily for 3 days. Patient taking differently: Place 1 spray into both nostrils daily as needed for allergies. 12/14/21 10/03/23  Gustavus Bryant, FNP  hydrochlorothiazide (HYDRODIURIL) 25 MG tablet Take 25 mg by mouth daily.    [provider]  ibuprofen (ADVIL) 800 MG tablet Take 1 tablet (800 mg total) by mouth every 8 (eight) hours as needed for moderate pain (pain score 4-6) or cramping. DO NOT TAKE WITH MELOXICAM. 10/03/23   Steva Ready, DO  insulin aspart (NOVOLOG FLEXPEN) 100 UNIT/ML FlexPen Inject 20-40 Units into the skin See admin instructions. Inject 40 units with breakfast, 40 units with lunch, and 20-40 units at dinner    [provider]  insulin glargine, 1 Unit Dial, (TOUJEO SOLOSTAR) 300 UNIT/ML Solostar Pen Inject 10-40 Units into the skin See admin instructions. Inject 40 units in the morning, may inject 10 units at night as needed for high blood sugar    [provider]  levocetirizine (XYZAL) 5 MG tablet Take  5 mg by mouth daily as needed for allergies.    [provider]  losartan (COZAAR) 100 MG tablet Take 100 mg by mouth daily.    [provider]  megestrol (MEGACE) 40 MG tablet Take 40 mg by mouth daily. 11/18/20   [provider]  meloxicam (MOBIC) 7.5 MG tablet Take 1 tablet (7.5 mg total) by mouth daily. Patient taking differently: Take 7.5 mg by mouth daily as needed for pain. 04/16/23   Antony Madura, PA-C  metoprolol succinate (TOPROL XL) 25 MG 24 hr tablet Take 1 tablet (25 mg  total) by mouth daily. 07/02/23   Nahser, Deloris Ping, MD  Multiple Vitamin (MULTI-VITAMIN) tablet Take 1 tablet by mouth every other day. Alternate with vitamin d    [provider]  rosuvastatin (CRESTOR) 10 MG tablet Take 10 mg by mouth daily.    [provider]  tirzepatide Greggory Keen) 5 MG/0.5ML Pen Inject 5 mg into the skin every Tuesday.    [provider]  traMADol (ULTRAM) 50 MG tablet Take 50 mg by mouth every 8 (eight) hours as needed for moderate pain. 07/24/23   [provider]      Allergies    Pioglitazone, Ace inhibitors, Canagliflozin, Amoxicillin, Metformin and related, Nabumetone, Penicillins, and Semaglutide    Review of Systems   Review of Systems  Musculoskeletal:  Positive for back pain.  All other systems reviewed and are negative.   Physical Exam Updated Vital Signs BP 103/79   Pulse 95   Temp 98.4 F (36.9 C) (Oral)   Resp 18   Ht 5\' 5"  (1.651 m)   Wt (!) 147.4 kg   LMP 05/12/2019 (Approximate) Comment: Has occasional spotting when she stops taking Me  SpO2 96%   BMI 54.08 kg/m  Physical Exam Vitals and nursing note reviewed.  Constitutional:      Appearance: Normal appearance.  HENT:     Head: Normocephalic and atraumatic.     Mouth/Throat:     Mouth: Mucous membranes are moist.  Eyes:     Conjunctiva/sclera: Conjunctivae normal.     Pupils: Pupils are equal, round, and reactive to light.  Cardiovascular:     Rate and Rhythm: Normal rate and regular rhythm.     Pulses: Normal pulses.     Heart sounds: Normal heart sounds.  Pulmonary:     Effort: Pulmonary effort is normal.     Breath sounds: Normal breath sounds.  Abdominal:     Palpations: Abdomen is soft.     Tenderness: There is no abdominal tenderness.  Musculoskeletal:        General: Tenderness present. Normal range of motion.     Comments: Tenderness to palpation of the right flank.  No midline tenderness to palpation of the cervical, thoracic, or  lumbar spine.  Range of motion and strength of upper and lower extremities remain intact.  Skin:    General: Skin is warm and dry.     Findings: No rash.  Neurological:     General: No focal deficit present.     Mental Status: She is alert.     Sensory: No sensory deficit.     Motor: No weakness.  Psychiatric:        Mood and Affect: Mood normal.        Behavior: Behavior normal.    ED Results / Procedures / Treatments   Labs (all labs ordered are listed, but only abnormal results are displayed) Labs Reviewed - No data  to display  EKG None  Radiology No results found.  Procedures Procedures: not indicated.   Medications Ordered in ED Medications  lidocaine (LIDODERM) 5 % 1 patch (1 patch Transdermal Patch Applied 11/25/23 1922)  ketorolac (TORADOL) 15 MG/ML injection 15 mg (15 mg Intramuscular Given 11/25/23 1923)  methocarbamol (ROBAXIN) tablet 500 mg (500 mg Oral Given 11/25/23 1923)    ED Course/ Medical Decision Making/ A&P                                 Medical Decision Making Risk OTC drugs. Prescription drug management.   This patient presents to the ED for concern of right-sided low back pain, this involves an extensive number of treatment options, and is a complaint that carries with it a high risk of complications and morbidity.   Differential diagnosis includes: Strain, spasm, radiculopathy, etc.   Comorbidities  See HPI above   Additional History  Additional history obtained from prior records.   Problem List / ED Course / Critical Interventions / Medication Management  Right-sided low back pain With shared decision making, we decided not to order imaging.  Since pain is located at the right lateral low back, imaging of the spine most likely would not show anything.  I discussed with patient that pain could be due to radiculopathy from her previously noted generative changes of the lumbar spine or could be due to muscle spasms or strain  since the pain feels like a squeezing sensation and is worse when she moves. I ordered medications including: Toradol, Robaxin, and lidocaine patch for musculoskeletal pain Reevaluation of the patient after these medicines showed that the patient improved I have reviewed the patients home medicines and have made adjustments as needed   Social Determinants of Health  Physical activity   Test / Admission - Considered  Discussed findings with patient.  All questions were answered. She is hemodynamically stable and safe for discharge home. Prescriptions for Robaxin, lidocaine patches, and naproxen sent to the pharmacy.  Advise close follow-up with primary care for reevaluation. Return precautions provided.       Final Clinical Impression(s) / ED Diagnoses Final diagnoses:  Muscle spasm    Rx / DC Orders ED Discharge Orders          Ordered    methocarbamol (ROBAXIN) 500 MG tablet  2 times daily        11/25/23 2029    lidocaine (HM LIDOCAINE PATCH) 4 %  Every 24 hours        11/25/23 2029    naproxen (NAPROSYN) 375 MG tablet  2 times daily        11/25/23 2029              Maxwell Marion, PA-C 11/26/23 0036    Arby Barrette, MD 11/30/23 [phone]

## 2023-11-25 NOTE — Discharge Instructions (Addendum)
As discussed, your pain is most likely due to muscle spasm.  I have sent a prescription of naproxen, Robaxin, and lidocaine patches to your pharmacy.  You can take naproxen up to twice a day as needed as well as the Robaxin (muscle relaxer).  The muscle relaxer can cause drowsiness, so do not drive or operate heavy machinery or take this medication.  Follow-up with your primary care provider for reevaluation.  Return to the ED if your symptoms worsen in the interim.

## 2024-01-16 ENCOUNTER — Encounter (HOSPITAL_BASED_OUTPATIENT_CLINIC_OR_DEPARTMENT_OTHER): Payer: Self-pay

## 2024-03-23 ENCOUNTER — Emergency Department (HOSPITAL_COMMUNITY)
Admission: EM | Admit: 2024-03-23 | Discharge: 2024-03-24 | Disposition: A | Discharge status: Home / Self Care | Attending: Emergency Medicine | Admitting: Emergency Medicine

## 2024-03-23 ENCOUNTER — Encounter (HOSPITAL_COMMUNITY): Payer: Self-pay

## 2024-03-23 ENCOUNTER — Other Ambulatory Visit: Payer: Self-pay

## 2024-03-23 ENCOUNTER — Emergency Department (HOSPITAL_COMMUNITY)

## 2024-03-23 DIAGNOSIS — Z794 Long term (current) use of insulin: Secondary | ICD-10-CM | POA: Diagnosis not present

## 2024-03-23 DIAGNOSIS — M5431 Sciatica, right side: Secondary | ICD-10-CM | POA: Insufficient documentation

## 2024-03-23 DIAGNOSIS — Z79899 Other long term (current) drug therapy: Secondary | ICD-10-CM | POA: Insufficient documentation

## 2024-03-23 DIAGNOSIS — M545 Low back pain, unspecified: Secondary | ICD-10-CM | POA: Diagnosis present

## 2024-03-23 DIAGNOSIS — I10 Essential (primary) hypertension: Secondary | ICD-10-CM | POA: Diagnosis not present

## 2024-03-23 DIAGNOSIS — E119 Type 2 diabetes mellitus without complications: Secondary | ICD-10-CM | POA: Insufficient documentation

## 2024-03-23 MED ORDER — OXYCODONE-ACETAMINOPHEN 5-325 MG PO TABS
1.0000 | ORAL_TABLET | Freq: Once | ORAL | Status: AC
  Administered 2024-03-24: 1 via ORAL
  Filled 2024-03-23: qty 1

## 2024-03-23 MED ORDER — LIDOCAINE 5 % EX PTCH
1.0000 | MEDICATED_PATCH | CUTANEOUS | Status: DC
  Administered 2024-03-24: 1 via TRANSDERMAL
  Filled 2024-03-23: qty 1

## 2024-03-23 NOTE — ED Triage Notes (Signed)
 Complaining of lower back pain that started yesterday evening. She thinks it "locked up"  denies any injury.

## 2024-03-24 MED ORDER — METHOCARBAMOL 500 MG PO TABS: 500.0000 mg | ORAL_TABLET | Freq: Two times a day (BID) | ORAL | 0 refills | Status: DC | PRN

## 2024-03-24 MED ORDER — OXYCODONE-ACETAMINOPHEN 5-325 MG PO TABS: 1.0000 | ORAL_TABLET | Freq: Four times a day (QID) | ORAL | 0 refills | Status: DC | PRN

## 2024-03-24 NOTE — Discharge Instructions (Addendum)
 You were seen in the ER today for your back pain. Your physical exam is reassuring. Follow up with the specialist listed below. You were seen in the emergency department today for your back pain.  Your physical exam and vital signs are very reassuring.  The muscles in your lower back are in what is called spasm, meaning they are inappropriately tightened up.  This can be quite painful.  To help with your pain you may take Tylenol and / or NSAID medication (such as ibuprofen or naproxen) to help with your pain.  Additionally, you have been prescribed a muscle relaxer called Robaxin to help relieve some of the muscle spasm.  Please be advised that this medication may make you very sleepy, so you should not drive or operate heavy machinery while you are taking it.  You may also utilize topical pain relief such as Biofreeze, IcyHot, or topical lidocaine patches.  I also recommend that you apply heat to the area, such as a hot shower or heating pad, and follow heat application with massage of the muscles that are most tight.  Please return to the emergency department if you develop any numbness/tingling/weakness in your arms or legs, any difficulty urinating, or urinary incontinence chest pain, shortness of breath, abdominal pain, nausea or vomiting that does not stop, or any other new severe symptoms.

## 2024-03-24 NOTE — ED Provider Notes (Signed)
 [place] EMERGENCY DEPARTMENT AT [facility] Provider Note   CSN: [id number] Arrival date & time: 03/23/24  1906     History  Chief Complaint  Patient presents with   Back Pain    Destiny Alexander is a 63 y.o. female with history of sciatica and degenerative disc disease in the low back who presents with concern for low back pain that started yesterday evening and shoots down the right leg without numbness tingling weakness saddle anesthesia urinary or fecal incontinence or retention.  No recent injury.  States that this feels like her sciatic flares.  History of 2 diabetes, hypertension, SVT.  She is not anticoagulated, took cyclobenzaprine at home without improvement.  HPI     Home Medications Prior to Admission medications   Medication Sig Start Date End Date Taking? Authorizing Provider  methocarbamol (ROBAXIN) 500 MG tablet Take 1 tablet (500 mg total) by mouth 2 (two) times daily as needed for muscle spasms. 03/24/24  Yes Malea Swilling, Eugene Gavia, PA-C  oxyCODONE-acetaminophen (PERCOCET/ROXICET) 5-325 MG tablet Take 1 tablet by mouth every 6 (six) hours as needed for severe pain (pain score 7-10). 03/24/24  Yes Asanti Craigo, Eugene Gavia, PA-C  acetaminophen (TYLENOL) 650 MG CR tablet Take 650 mg by mouth 2 (two) times daily as needed for pain.    [provider]  amLODipine (NORVASC) 5 MG tablet Take 5 mg by mouth daily.    [provider]  cholecalciferol (VITAMIN D3) 25 MCG (1000 UNIT) tablet Take 4,000 Units by mouth every other day. Alternates days with multivitamin    [provider]  Continuous Blood Gluc Sensor (FREESTYLE LIBRE 2 SENSOR) MISC  12/14/20   [provider]  FARXIGA 5 MG TABS tablet Take 5 mg by mouth daily. 01/23/23   [provider]  fluticasone (FLONASE) 50 MCG/ACT nasal spray Place 1 spray into both nostrils daily for 3 days. Patient taking differently: Place 1 spray into both nostrils daily as needed for  allergies. 12/14/21 10/03/23  Gustavus Bryant, FNP  hydrochlorothiazide (HYDRODIURIL) 25 MG tablet Take 25 mg by mouth daily.    [provider]  ibuprofen (ADVIL) 800 MG tablet Take 1 tablet (800 mg total) by mouth every 8 (eight) hours as needed for moderate pain (pain score 4-6) or cramping. DO NOT TAKE WITH MELOXICAM. 10/03/23   Steva Ready, DO  insulin aspart (NOVOLOG FLEXPEN) 100 UNIT/ML FlexPen Inject 20-40 Units into the skin See admin instructions. Inject 40 units with breakfast, 40 units with lunch, and 20-40 units at dinner    [provider]  insulin glargine, 1 Unit Dial, (TOUJEO SOLOSTAR) 300 UNIT/ML Solostar Pen Inject 10-40 Units into the skin See admin instructions. Inject 40 units in the morning, may inject 10 units at night as needed for high blood sugar    [provider]  levocetirizine (XYZAL) 5 MG tablet Take 5 mg by mouth daily as needed for allergies.    [provider]  losartan (COZAAR) 100 MG tablet Take 100 mg by mouth daily.    [provider]  megestrol (MEGACE) 40 MG tablet Take 40 mg by mouth daily. 11/18/20   [provider]  meloxicam (MOBIC) 7.5 MG tablet Take 1 tablet (7.5 mg total) by mouth daily. Patient taking differently: Take 7.5 mg by mouth daily as needed for pain. 04/16/23   Antony Madura, PA-C  metoprolol succinate (TOPROL XL) 25 MG 24 hr tablet Take 1 tablet (25 mg total) by mouth daily. 07/02/23  Nahser, Lela Purple, MD  Multiple Vitamin (MULTI-VITAMIN) tablet Take 1 tablet by mouth every other day. Alternate with vitamin d    [provider]  rosuvastatin (CRESTOR) 10 MG tablet Take 10 mg by mouth daily.    [provider]  tirzepatide Florence Hunt) 5 MG/0.5ML Pen Inject 5 mg into the skin every Tuesday.    [provider]  traMADol (ULTRAM) 50 MG tablet Take 50 mg by mouth every 8 (eight) hours as needed for moderate pain. 07/24/23   [provider]      Allergies     Pioglitazone, Ace inhibitors, Canagliflozin, Amoxicillin, Metformin and related, Nabumetone, Penicillins, and Semaglutide    Review of Systems   Review of Systems  Constitutional: Negative.   HENT: Negative.    Respiratory: Negative.    Cardiovascular: Negative.   Gastrointestinal: Negative.   Genitourinary: Negative.   Musculoskeletal:  Positive for back pain.  Skin: Negative.   Neurological:        NO saddle anesthesia.    Physical Exam Updated Vital Signs BP (!) 118/47 (BP Location: Left Arm)   Pulse 89   Temp 98 F (36.7 C) (Oral)   Resp 16   Ht 5\' 5"  (1.651 m)   Wt (!) 140.6 kg   LMP 05/12/2019 (Approximate) Comment: Has occasional spotting when she stops taking Me  SpO2 98%   BMI 51.59 kg/m  Physical Exam Vitals and nursing note reviewed.  Constitutional:      Appearance: She is obese. She is not ill-appearing or toxic-appearing.  HENT:     Head: Normocephalic and atraumatic.     Mouth/Throat:     Mouth: Mucous membranes are moist.     Pharynx: No oropharyngeal exudate or posterior oropharyngeal erythema.  Eyes:     General:        Right eye: No discharge.        Left eye: No discharge.     Conjunctiva/sclera: Conjunctivae normal.  Cardiovascular:     Rate and Rhythm: Normal rate and regular rhythm.     Pulses: Normal pulses.  Pulmonary:     Effort: Pulmonary effort is normal. No respiratory distress.     Breath sounds: Normal breath sounds. No wheezing or rales.  Abdominal:     General: Bowel sounds are normal. There is no distension.     Tenderness: There is no abdominal tenderness.  Musculoskeletal:        General: No deformity.     Cervical back: Neck supple.     Right lower leg: No edema.     Left lower leg: No edema.     Comments: No midline tenderness palpation of the T or L-spine, exam limited by patient's body habitus.  Skin:    General: Skin is warm and dry.     Capillary Refill: Capillary refill takes less than 2 seconds.  Neurological:      General: No focal deficit present.     Mental Status: She is alert and oriented to person, place, and time. Mental status is at baseline.     GCS: GCS eye subscore is 4. GCS verbal subscore is 5. GCS motor subscore is 6.     Cranial Nerves: Cranial nerves 2-12 are intact.     Sensory: Sensation is intact.     Motor: Motor function is intact.     Coordination: Coordination is intact.     Gait: Gait is intact.     Comments: Symmetric strength and sensation in the lower  extremities bilaterally. Ambulatory.   Psychiatric:        Mood and Affect: Mood normal.     ED Results / Procedures / Treatments   Labs (all labs ordered are listed, but only abnormal results are displayed) Labs Reviewed - No data to display  EKG None  Radiology DG Lumbar Spine Complete Result Date: 03/24/2024 CLINICAL DATA:  Back pain EXAM: LUMBAR SPINE - COMPLETE 4+ VIEW COMPARISON:  04/20/2023 FINDINGS: Grade 1 anterolisthesis L4-5 of 7-8 mm, progressive since prior examination. Mild superimposed intervertebral disc space narrowing at L4-5 in keeping with changes of mild degenerative disc disease. Otherwise normal lumbar lordosis. Vertebral body heights are preserved. Remaining intervertebral disc heights are preserved. Oblique views are limited by underpenetration. IMPRESSION: 1. Progressive grade 1 anterolisthesis L4-5. 2. Mild degenerative disc disease L4-5. Electronically Signed   By: Helyn Numbers M.D.   On: 03/24/2024 00:13    Procedures Procedures    Medications Ordered in ED Medications  lidocaine (LIDODERM) 5 % 1 patch (1 patch Transdermal Patient Refused/Not Given 03/24/24 0003)  oxyCODONE-acetaminophen (PERCOCET/ROXICET) 5-325 MG per tablet 1 tablet (1 tablet Oral Given 03/24/24 0003)    ED Course/ Medical Decision Making/ A&P                                 Medical Decision Making 63 year old female with low back pain that shoots down the right leg.  No red flag syndromes, no injury IVDU or  anticoagulant no trauma.  Never done intake of medicines otherwise normal.  Cardiopulmonary abdominal exams are benign.  Patient neurovascular intact in bilateral lower extremities, no muscular tenderness to palpation over the right low back and right hip, no midline tenderness palpation though exam is limited by patient's body habitus.  The emergent differential diagnosis for low back pain includes acute ligamentous and muscular injury, cord compression syndrome, pathologic fracture, transverse myelitis, vertebral osteomyelitis, discitis, and epidural abscess.   Amount and/or Complexity of Data Reviewed Radiology: ordered.    Details: Anterolisthesis at L4-L5 and degenerative disc disease.  Risk Prescription drug management.  Afebrile, nontraumatic back pain consistent with patient's prior sciatica, no evidence of neural compromise, no saddle anesthesia. Clinical picture most consistent with sciatica.  Percocet and lidocaine patches offered in the ED.  Conservative management at home, will provide rehab information.  Recommend follow-up with spine specialist.  Clinical concern for emergent underlying condition that would warrant ED workup and patient management is exceedingly low.  Adilenne voiced understanding of her medical evaluation and treatment plan. Each of their questions answered to their expressed satisfaction.  Return precautions were given.  Patient is well-appearing, stable, and was discharged in good condition.  This chart was dictated using voice recognition software, Dragon. Despite the best efforts of this provider to proofread and correct errors, errors may still occur which can change documentation meaning.         Final Clinical Impression(s) / ED Diagnoses Final diagnoses:  Sciatica of right side    Rx / DC Orders ED Discharge Orders          Ordered    methocarbamol (ROBAXIN) 500 MG tablet  2 times daily PRN        03/24/24 0103    oxyCODONE-acetaminophen  (PERCOCET/ROXICET) 5-325 MG tablet  Every 6 hours PRN        03/24/24 0129              Quantavius Humm, Lupe Carney  R, PA-C 03/24/24 0130    Palumbo, April, MD 03/24/24 [phone]

## 2024-04-03 ENCOUNTER — Ambulatory Visit (HOSPITAL_BASED_OUTPATIENT_CLINIC_OR_DEPARTMENT_OTHER): Payer: No Typology Code available for payment source | Admitting: Nurse Practitioner

## 2024-05-29 ENCOUNTER — Other Ambulatory Visit: Payer: Self-pay | Admitting: Obstetrics and Gynecology

## 2024-05-29 ENCOUNTER — Ambulatory Visit
Admission: RE | Admit: 2024-05-29 | Discharge: 2024-05-29 | Disposition: A | Discharge status: Home / Self Care | Source: Ambulatory Visit | Attending: Obstetrics and Gynecology | Admitting: Obstetrics and Gynecology

## 2024-05-29 DIAGNOSIS — Z0000 Encounter for general adult medical examination without abnormal findings: Secondary | ICD-10-CM

## 2024-06-02 NOTE — Progress Notes (Unsigned)
  Cardiology Office Note:  .   Date:  06/03/2024  ID:  Destiny Alexander, DOB 20-Aug-1961, MRN [id number] PCP: Cleotilde Planas, MD  [facility] Providers Cardiologist:  new to [facility]   History of Present Illness: Destiny Alexander   Destiny Alexander is a 63 y.o. female with hx of SVT. Has seen Daril Kicks, [place] in the past  She was seen in the ER in May, 2024 for tachycardia She had been sent over from her primary MD with a HR of 130s ECG revealed atrial tachycardia  Went to the ER in May, 2024 HR was in the 130s Chest CTA was negative for PE Was started on Diltiazem  120 mg a day for 1 month.  She did not have any refills   HR increased when she drinks caffeine   Walks on occasion   No CP  Does have DOE Has gained some weight recently  Is on Megace for uterine bleeding,  this is also contributing to her weight gain .   Echo from Jan, 2022 - showed normal LV function  No significant valvular disease   June 03, 2024 Destiny Alexander is seen for follow up of her morbid obesity  She was last seen by Rosaline Bane, NP in Oct. 2024  She is markedly deconditioned , Has scattered coronary calcifications on CT 04/2023  Has been doing PT for a slipped disc in her back  Wt is 323 today      ROS:    Studies Reviewed: Destiny Alexander   EKG Interpretation Date/Time:  Tuesday June 03 2024 16:12:26 EDT Ventricular Rate:  102 PR Interval:  162 QRS Duration:  84 QT Interval:  364 QTC Calculation: 474 R Axis:   9  Text Interpretation: Sinus tachycardia Minimal voltage criteria for LVH, may be normal variant ( R in aVL ) Anterior infarct , age undetermined When compared with ECG of 01-May-2023 18:26, PREVIOUS ECG IS PRESENT Confirmed by Alveta Mungo [id number]) on 06/03/2024 7:29:43 PM      Risk Assessment/Calculations:           Physical Exam:    Physical Exam: Blood pressure 134/88, pulse (!) 104, height 5' 5 (1.651 m), weight (!) 323 lb (146.5 kg), last menstrual period 05/12/2019, SpO2 97%.       GEN:   morbidly obese , middle age female , in no acute distress HEENT: Normal NECK: No JVD; No carotid bruits LYMPHATICS: No lymphadenopathy CARDIAC: RRR , no murmurs, rubs, gallops RESPIRATORY:  Clear to auscultation without rales, wheezing or rhonchi  ABDOMEN: Soft, non-tender, non-distended MUSCULOSKELETAL:  No edema; No deformity  SKIN: Warm and dry NEUROLOGIC:  Alert and oriented x 3   ASSESSMENT AND PLAN: .    Sinus tachycardia-    will increase her metoprolol  xl to 50 mg a day   2.  Morbid obesity :encourage continued weight loss    3.   HTN:    BP is overall well controlled.         Dispo: 1 year    Signed, Mungo Alveta, MD

## 2024-06-03 ENCOUNTER — Encounter: Payer: Self-pay | Admitting: Cardiovascular Disease

## 2024-06-03 ENCOUNTER — Ambulatory Visit: Attending: Cardiovascular Disease | Admitting: Cardiovascular Disease

## 2024-06-03 VITALS — BP 134/88 | HR 104 | Ht 65.0 in | Wt 323.0 lb

## 2024-06-03 DIAGNOSIS — I10 Essential (primary) hypertension: Secondary | ICD-10-CM | POA: Diagnosis not present

## 2024-06-03 DIAGNOSIS — E785 Hyperlipidemia, unspecified: Secondary | ICD-10-CM | POA: Diagnosis not present

## 2024-06-03 MED ORDER — METOPROLOL SUCCINATE ER 50 MG PO TB24: 50.0000 mg | ORAL_TABLET | Freq: Every day | ORAL | 3 refills | Status: AC

## 2024-06-03 NOTE — Patient Instructions (Signed)
 Medication Instructions:  INCREASE Metoprolol  Succinate to 50mg  daily *If you need a refill on your cardiac medications before your next appointment, please call your pharmacy*  Follow-Up: At [facility] you and your health needs are our priority.  As part of our continuing mission to provide you with exceptional heart care, our providers are all part of one team.  This team includes your primary Cardiologist (physician) and Advanced Practice Providers or APPs (Physician Assistants and Nurse Practitioners) who all work together to provide you with the care you need, when you need it.  Your next appointment:   1 year(s)  Provider:   Aleene Passe, MD

## 2024-09-17 ENCOUNTER — Other Ambulatory Visit: Payer: Self-pay | Admitting: Medical Genetics

## 2024-09-17 DIAGNOSIS — Z006 Encounter for examination for normal comparison and control in clinical research program: Secondary | ICD-10-CM

## 2024-09-27 LAB — GENECONNECT MOLECULAR SCREEN

## 2024-09-30 ENCOUNTER — Telehealth: Payer: Self-pay | Admitting: Medical Genetics

## 2024-10-01 ENCOUNTER — Ambulatory Visit: Payer: Self-pay | Admitting: Medical Genetics

## 2024-10-07 NOTE — Telephone Encounter (Signed)
 [facility]  10/07/2024 1:30 PM  Attempted to contact participant three times to discuss TNP result. Left voicemail's with callback requests.   Jordyn Pennstrom, BS [place]  [facility] Clinical Research Specialist II Direct Dial: [phone]  Fax: [phone]

## 2024-10-28 ENCOUNTER — Ambulatory Visit
Admission: EM | Admit: 2024-10-28 | Discharge: 2024-10-28 | Disposition: A | Discharge status: Home / Self Care | Attending: Family Medicine | Admitting: Family Medicine

## 2024-10-28 ENCOUNTER — Encounter: Payer: Self-pay | Admitting: Emergency Medicine

## 2024-10-28 ENCOUNTER — Other Ambulatory Visit: Payer: Self-pay

## 2024-10-28 DIAGNOSIS — J069 Acute upper respiratory infection, unspecified: Secondary | ICD-10-CM

## 2024-10-28 MED ORDER — PREDNISONE 20 MG PO TABS: 40.0000 mg | ORAL_TABLET | Freq: Every day | ORAL | 0 refills | Status: AC

## 2024-10-28 MED ORDER — BENZONATATE 200 MG PO CAPS: 200.0000 mg | ORAL_CAPSULE | Freq: Three times a day (TID) | ORAL | 0 refills | Status: AC | PRN

## 2024-10-28 NOTE — ED Triage Notes (Signed)
 Pt here for cough and nasal congestion x 4 days; pt denies fever

## 2024-10-28 NOTE — ED Provider Notes (Signed)
 [facility]    CSN: [id number] Arrival date & time: 10/28/24  0802      History   Chief Complaint Chief Complaint  Patient presents with   Nasal Congestion    HPI Destiny Alexander is a 63 y.o. female.   Patient is here for cough and congestion x 4 days.  No sinus pain, pressure.  It started in her throat with a tickle and sore throat.  Some wheezing/sob.  No h/o asthma.  ?slight fever over the weekend.  Used flonase , tylenol , xyzal.        Past Medical History:  Diagnosis Date   Arthritis    Diabetes mellitus    Dysrhythmia    Hypertension     Patient Active Problem List   Diagnosis Date Noted   Abnormal uterine bleeding (AUB) 10/03/2023   Sinus tachycardia 07/02/2023   SVT (supraventricular tachycardia) 12/17/2020   Post concussion syndrome 01/08/2017   Type 2 diabetes mellitus without complication, with long-term current use of insulin  (HCC) 01/08/2017   Essential hypertension 01/08/2017   BMI 50.0-59.9, adult (HCC) 01/08/2017   Concussion and edema of cervical spinal cord (HCC) 01/08/2017    Past Surgical History:  Procedure Laterality Date   APPENDECTOMY     CESAREAN SECTION     x 2   CHOLECYSTECTOMY     COLONOSCOPY WITH PROPOFOL  N/A 10/25/2022   Procedure: COLONOSCOPY WITH PROPOFOL ;  Surgeon: Burnette Fallow, MD;  Location: [facility];  Service: Gastroenterology;  Laterality: N/A;   DILATATION & CURETTAGE/HYSTEROSCOPY WITH MYOSURE N/A 10/03/2023   Procedure: DILATATION & CURETTAGE/HYSTEROSCOPY WITH MYOSURE POLYPECTOMY;  Surgeon: Storm Setter, DO;  Location: [facility] OR;  Service: Gynecology;  Laterality: N/A;   LIVER SURGERY     piece of liver removed w/choley   TUBAL LIGATION      OB History   No obstetric history on file.      Home Medications    Prior to Admission medications   Medication Sig Start Date End Date Taking? Authorizing Provider  acetaminophen  (TYLENOL ) 650 MG CR tablet Take 650 mg by mouth 2 (two) times daily  as needed for pain.    [provider]  amLODipine (NORVASC) 5 MG tablet Take 5 mg by mouth daily.    [provider]  cholecalciferol (VITAMIN D3) 25 MCG (1000 UNIT) tablet Take 4,000 Units by mouth every other day. Alternates days with multivitamin    [provider]  Continuous Blood Gluc Sensor (FREESTYLE LIBRE 2 SENSOR) MISC  12/14/20   [provider]  cyclobenzaprine  (FLEXERIL ) 10 MG tablet Take 10 mg by mouth at bedtime.    [provider]  fluticasone  (FLONASE ) 50 MCG/ACT nasal spray Place 1 spray into both nostrils daily for 3 days. Patient taking differently: Place 1 spray into both nostrils daily as needed for allergies. 12/14/21 06/03/24  Hazen Darryle BRAVO, FNP  HUMALOG KWIKPEN 200 UNIT/ML KwikPen SMARTSIG:40 Unit(s) SUB-Q 3 Times Daily 05/02/24   [provider]  hydrochlorothiazide (HYDRODIURIL) 25 MG tablet Take 25 mg by mouth daily.    [provider]  ibuprofen  (ADVIL ) 800 MG tablet Take 1 tablet (800 mg total) by mouth every 8 (eight) hours as needed for moderate pain (pain score 4-6) or cramping. DO NOT TAKE WITH MELOXICAM . 10/03/23   Davies, Melissa, DO  levocetirizine (XYZAL) 5 MG tablet Take 5 mg by mouth daily as needed for allergies.    [provider]  losartan (COZAAR) 100 MG tablet Take 100 mg by mouth daily.  [provider]  meloxicam  (MOBIC ) 7.5 MG tablet Take 1 tablet (7.5 mg total) by mouth daily. Patient taking differently: Take 7.5 mg by mouth daily as needed for pain. 04/16/23   Keith Sor, PA-C  metoprolol  succinate (TOPROL -XL) 50 MG 24 hr tablet Take 1 tablet (50 mg total) by mouth daily. Take with or immediately following a meal. 06/03/24   Nahser, Aleene PARAS, MD  Multiple Vitamin (MULTI-VITAMIN) tablet Take 1 tablet by mouth every other day. Alternate with vitamin d    [provider]  rosuvastatin (CRESTOR) 10 MG tablet Take 10 mg by mouth daily.    [provider]   tirzepatide CLOYDE) 5 MG/0.5ML Pen Inject 5 mg into the skin every Tuesday.    [provider]  traMADol (ULTRAM) 50 MG tablet Take 50 mg by mouth every 8 (eight) hours as needed for moderate pain. 07/24/23   [provider]  TRESIBA FLEXTOUCH 100 UNIT/ML FlexTouch Pen Inject into the skin.    [provider]    Family History Family History  Problem Relation Age of Onset   CVA Mother    Diabetes Mother    Hypertension Mother    Arthritis/Rheumatoid Mother    Heart failure Father    Diabetes Father    Hypertension Father    Diabetes Brother    Hypertension Sister     Social History Social History   Tobacco Use   Smoking status: Former    Types: Cigarettes    Start date: 11/24/2016   Smokeless tobacco: Never   Tobacco comments:    quit 1985  Substance Use Topics   Alcohol use: No    Comment: quit 1985   Drug use: No     Allergies   Pioglitazone, Ace inhibitors, Canagliflozin, Amoxicillin, Metformin and related, Nabumetone, Penicillins, and Semaglutide   Review of Systems Review of Systems  Constitutional: Negative.   HENT:  Positive for congestion and rhinorrhea.   Respiratory:  Positive for cough and shortness of breath.   Gastrointestinal: Negative.   Genitourinary: Negative.   Musculoskeletal: Negative.   Psychiatric/Behavioral: Negative.       Physical Exam Triage Vital Signs ED Triage Vitals  Encounter Vitals Group     BP 10/28/24 0824 (!) 175/81     Girls Systolic BP Percentile --      Girls Diastolic BP Percentile --      Boys Systolic BP Percentile --      Boys Diastolic BP Percentile --      Pulse Rate 10/28/24 0824 90     Resp 10/28/24 0824 18     Temp 10/28/24 0824 98.4 F (36.9 C)     Temp Source 10/28/24 0824 Oral     SpO2 10/28/24 0824 95 %     Weight --      Height --      Head Circumference --      Peak Flow --      Pain Score 10/28/24 0825 0     Pain Loc --      Pain Education --      Exclude from  Growth Chart --    No data found.  Updated Vital Signs BP (!) 175/81 (BP Location: Left Arm)   Pulse 90   Temp 98.4 F (36.9 C) (Oral)   Resp 18   LMP 05/12/2019 (Approximate) Comment: Has occasional spotting when she stops taking Me  SpO2 95%   Visual Acuity Right Eye Distance:   Left Eye Distance:  Bilateral Distance:    Right Eye Near:   Left Eye Near:    Bilateral Near:     Physical Exam Constitutional:      General: She is not in acute distress.    Appearance: Normal appearance. She is normal weight. She is not ill-appearing or toxic-appearing.  HENT:     Nose: Congestion present.     Right Sinus: No maxillary sinus tenderness.     Left Sinus: No maxillary sinus tenderness.     Mouth/Throat:     Mouth: Mucous membranes are moist.  Cardiovascular:     Rate and Rhythm: Normal rate and regular rhythm.  Pulmonary:     Effort: Pulmonary effort is normal.     Breath sounds: Normal breath sounds. No wheezing.  Musculoskeletal:     Cervical back: Normal range of motion and neck supple.  Lymphadenopathy:     Cervical: No cervical adenopathy.  Skin:    General: Skin is warm.  Neurological:     General: No focal deficit present.     Mental Status: She is alert.  Psychiatric:        Mood and Affect: Mood normal.      UC Treatments / Results  Labs (all labs ordered are listed, but only abnormal results are displayed) Labs Reviewed - No data to display  EKG   Radiology No results found.  Procedures Procedures (including critical care time)  Medications Ordered in UC Medications - No data to display  Initial Impression / Assessment and Plan / UC Course  I have reviewed the triage vital signs and the nursing notes.  Pertinent labs & imaging results that were available during my care of the patient were reviewed by me and considered in my medical decision making (see chart for details).   Final Clinical Impressions(s) / UC Diagnoses   Final diagnoses:   Viral upper respiratory infection     Discharge Instructions      You were seen for upper respiratory symptoms, which appear viral at this time.  I have sent out a 5 day course of steroid and cough  medication.  This may cause a slight increase in your blood sugar so keep an eye on that.  You may continue your current medications.  Please return if not improving.     ED Prescriptions     Medication Sig Dispense Auth. Provider   predniSONE  (DELTASONE ) 20 MG tablet Take 2 tablets (40 mg total) by mouth daily for 5 days. 10 tablet Tynslee Bowlds, MD   benzonatate  (TESSALON ) 200 MG capsule Take 1 capsule (200 mg total) by mouth 3 (three) times daily as needed for cough. 21 capsule Darral Longs, MD      PDMP not reviewed this encounter.   Darral Longs, MD 10/28/24 [phone]

## 2024-10-28 NOTE — Discharge Instructions (Signed)
 You were seen for upper respiratory symptoms, which appear viral at this time.  I have sent out a 5 day course of steroid and cough  medication.  This may cause a slight increase in your blood sugar so keep an eye on that.  You may continue your current medications.  Please return if not improving.
